# Patient Record
Sex: Female | Born: 1960 | Race: White | Hispanic: No | Marital: Married | Smoking: Current some day smoker
Health system: Southern US, Community
[De-identification: ages and names within clinical notes are randomized; demographics above are authoritative.]

## PROBLEM LIST (undated history)

## (undated) DIAGNOSIS — M1990 Unspecified osteoarthritis, unspecified site: Secondary | ICD-10-CM

## (undated) DIAGNOSIS — R51 Headache: Secondary | ICD-10-CM

## (undated) DIAGNOSIS — E119 Type 2 diabetes mellitus without complications: Secondary | ICD-10-CM

## (undated) DIAGNOSIS — T7840XA Allergy, unspecified, initial encounter: Secondary | ICD-10-CM

## (undated) DIAGNOSIS — K859 Acute pancreatitis without necrosis or infection, unspecified: Secondary | ICD-10-CM

## (undated) DIAGNOSIS — J45909 Unspecified asthma, uncomplicated: Secondary | ICD-10-CM

## (undated) DIAGNOSIS — Z9289 Personal history of other medical treatment: Secondary | ICD-10-CM

## (undated) DIAGNOSIS — I10 Essential (primary) hypertension: Secondary | ICD-10-CM

## (undated) DIAGNOSIS — K219 Gastro-esophageal reflux disease without esophagitis: Secondary | ICD-10-CM

## (undated) DIAGNOSIS — R519 Headache, unspecified: Secondary | ICD-10-CM

## (undated) HISTORY — DX: Allergy, unspecified, initial encounter: T78.40XA

---

## 1998-07-22 ENCOUNTER — Emergency Department (HOSPITAL_COMMUNITY): Admission: EM | Admit: 1998-07-22 | Discharge: 1998-07-22 | Payer: Self-pay | Admitting: Emergency Medicine

## 1998-07-23 ENCOUNTER — Encounter: Payer: Self-pay | Admitting: Emergency Medicine

## 1998-09-11 ENCOUNTER — Emergency Department (HOSPITAL_COMMUNITY): Admission: EM | Admit: 1998-09-11 | Discharge: 1998-09-11 | Payer: Self-pay | Admitting: *Deleted

## 2002-06-29 ENCOUNTER — Emergency Department (HOSPITAL_COMMUNITY): Admission: EM | Admit: 2002-06-29 | Discharge: 2002-06-29 | Payer: Self-pay | Admitting: Unknown Physician Specialty

## 2004-05-23 ENCOUNTER — Emergency Department (HOSPITAL_COMMUNITY): Admission: EM | Admit: 2004-05-23 | Discharge: 2004-05-23 | Payer: Self-pay | Admitting: Emergency Medicine

## 2006-07-31 ENCOUNTER — Emergency Department (HOSPITAL_COMMUNITY): Admission: EM | Admit: 2006-07-31 | Discharge: 2006-07-31 | Payer: Self-pay | Admitting: Emergency Medicine

## 2010-09-19 DIAGNOSIS — Z9289 Personal history of other medical treatment: Secondary | ICD-10-CM

## 2010-09-19 HISTORY — PX: CHOLECYSTECTOMY: SHX55

## 2010-09-19 HISTORY — DX: Personal history of other medical treatment: Z92.89

## 2010-12-04 ENCOUNTER — Inpatient Hospital Stay (HOSPITAL_COMMUNITY)
Admission: EM | Admit: 2010-12-04 | Discharge: 2010-12-13 | DRG: 417 | Disposition: A | Discharge status: Home / Self Care | Payer: Self-pay | Attending: Internal Medicine | Admitting: Internal Medicine

## 2010-12-04 ENCOUNTER — Emergency Department (HOSPITAL_COMMUNITY): Payer: Self-pay

## 2010-12-04 ENCOUNTER — Inpatient Hospital Stay (HOSPITAL_COMMUNITY): Payer: Self-pay

## 2010-12-04 DIAGNOSIS — N289 Disorder of kidney and ureter, unspecified: Secondary | ICD-10-CM | POA: Diagnosis not present

## 2010-12-04 DIAGNOSIS — E876 Hypokalemia: Secondary | ICD-10-CM | POA: Diagnosis not present

## 2010-12-04 DIAGNOSIS — Y836 Removal of other organ (partial) (total) as the cause of abnormal reaction of the patient, or of later complication, without mention of misadventure at the time of the procedure: Secondary | ICD-10-CM | POA: Diagnosis not present

## 2010-12-04 DIAGNOSIS — F17200 Nicotine dependence, unspecified, uncomplicated: Secondary | ICD-10-CM | POA: Diagnosis present

## 2010-12-04 DIAGNOSIS — E785 Hyperlipidemia, unspecified: Secondary | ICD-10-CM | POA: Diagnosis present

## 2010-12-04 DIAGNOSIS — D62 Acute posthemorrhagic anemia: Secondary | ICD-10-CM | POA: Diagnosis not present

## 2010-12-04 DIAGNOSIS — J45909 Unspecified asthma, uncomplicated: Secondary | ICD-10-CM | POA: Diagnosis present

## 2010-12-04 DIAGNOSIS — K859 Acute pancreatitis without necrosis or infection, unspecified: Secondary | ICD-10-CM | POA: Diagnosis present

## 2010-12-04 DIAGNOSIS — K5900 Constipation, unspecified: Secondary | ICD-10-CM | POA: Diagnosis not present

## 2010-12-04 DIAGNOSIS — IMO0002 Reserved for concepts with insufficient information to code with codable children: Secondary | ICD-10-CM | POA: Diagnosis not present

## 2010-12-04 DIAGNOSIS — I10 Essential (primary) hypertension: Secondary | ICD-10-CM | POA: Diagnosis present

## 2010-12-04 DIAGNOSIS — K8020 Calculus of gallbladder without cholecystitis without obstruction: Principal | ICD-10-CM | POA: Diagnosis present

## 2010-12-04 LAB — DIFFERENTIAL
Basophils Absolute: 0 10*3/uL (ref 0.0–0.1)
Basophils Relative: 0 % (ref 0–1)
Eosinophils Absolute: 0 10*3/uL (ref 0.0–0.7)
Eosinophils Relative: 0 % (ref 0–5)
Lymphocytes Relative: 7 % — ABNORMAL LOW (ref 12–46)
Lymphs Abs: 1.2 10*3/uL (ref 0.7–4.0)
Monocytes Absolute: 1 10*3/uL (ref 0.1–1.0)
Monocytes Relative: 6 % (ref 3–12)
Neutro Abs: 14.7 10*3/uL — ABNORMAL HIGH (ref 1.7–7.7)
Neutrophils Relative %: 87 % — ABNORMAL HIGH (ref 43–77)

## 2010-12-04 LAB — URINALYSIS, ROUTINE W REFLEX MICROSCOPIC
Nitrite: NEGATIVE
Specific Gravity, Urine: 1.017 (ref 1.005–1.030)
pH: 6.5 (ref 5.0–8.0)

## 2010-12-04 LAB — HEMOGLOBIN A1C: Mean Plasma Glucose: 114 mg/dL (ref <117)

## 2010-12-04 LAB — URINE MICROSCOPIC-ADD ON

## 2010-12-04 LAB — CBC
HCT: 45.4 % (ref 36.0–46.0)
MCHC: 35.2 g/dL (ref 30.0–36.0)
MCV: 92.7 fL (ref 78.0–100.0)
Platelets: 386 10*3/uL (ref 150–400)
RDW: 12.8 % (ref 11.5–15.5)

## 2010-12-04 LAB — COMPREHENSIVE METABOLIC PANEL
BUN: 18 mg/dL (ref 6–23)
Calcium: 9.3 mg/dL (ref 8.4–10.5)
Glucose, Bld: 159 mg/dL — ABNORMAL HIGH (ref 70–99)
Total Protein: 7.4 g/dL (ref 6.0–8.3)

## 2010-12-04 LAB — LIPASE, BLOOD: Lipase: 3004 U/L — ABNORMAL HIGH (ref 11–59)

## 2010-12-04 LAB — TSH: TSH: 0.874 u[IU]/mL (ref 0.350–4.500)

## 2010-12-04 MED ORDER — IOHEXOL 300 MG/ML  SOLN
100.0000 mL | Freq: Once | INTRAMUSCULAR | Status: AC | PRN
  Administered 2010-12-04: 100 mL via INTRAVENOUS

## 2010-12-05 LAB — DIFFERENTIAL
Basophils Absolute: 0 10*3/uL (ref 0.0–0.1)
Eosinophils Relative: 0 % (ref 0–5)
Lymphocytes Relative: 6 % — ABNORMAL LOW (ref 12–46)
Neutro Abs: 20.6 10*3/uL — ABNORMAL HIGH (ref 1.7–7.7)

## 2010-12-05 LAB — LACTIC ACID, PLASMA: Lactic Acid, Venous: 1.8 mmol/L (ref 0.5–2.2)

## 2010-12-05 LAB — CBC
HCT: 41.9 % (ref 36.0–46.0)
Platelets: 330 10*3/uL (ref 150–400)
RDW: 13.4 % (ref 11.5–15.5)
WBC: 23.4 10*3/uL — ABNORMAL HIGH (ref 4.0–10.5)

## 2010-12-05 LAB — COMPREHENSIVE METABOLIC PANEL
ALT: 351 U/L — ABNORMAL HIGH (ref 0–35)
AST: 100 U/L — ABNORMAL HIGH (ref 0–37)
Albumin: 3.3 g/dL — ABNORMAL LOW (ref 3.5–5.2)
CO2: 30 mEq/L (ref 19–32)
Calcium: 8.8 mg/dL (ref 8.4–10.5)
GFR calc Af Amer: >60 mL/min (ref >60)
Sodium: 142 mEq/L (ref 135–145)
Total Protein: 6.4 g/dL (ref 6.0–8.3)

## 2010-12-06 ENCOUNTER — Inpatient Hospital Stay (HOSPITAL_COMMUNITY): Payer: Self-pay

## 2010-12-06 DIAGNOSIS — K8020 Calculus of gallbladder without cholecystitis without obstruction: Secondary | ICD-10-CM

## 2010-12-06 DIAGNOSIS — K859 Acute pancreatitis without necrosis or infection, unspecified: Secondary | ICD-10-CM

## 2010-12-06 LAB — DIFFERENTIAL
Basophils Absolute: 0 10*3/uL (ref 0.0–0.1)
Basophils Relative: 0 % (ref 0–1)
Eosinophils Absolute: 0 10*3/uL (ref 0.0–0.7)
Eosinophils Relative: 0 % (ref 0–5)
Lymphocytes Relative: 5 % — ABNORMAL LOW (ref 12–46)
Lymphs Abs: 1.3 10*3/uL (ref 0.7–4.0)
Monocytes Absolute: 1.5 10*3/uL — ABNORMAL HIGH (ref 0.1–1.0)
Monocytes Relative: 6 % (ref 3–12)
Neutro Abs: 23.4 10*3/uL — ABNORMAL HIGH (ref 1.7–7.7)
Neutro Abs: 22.5 10*3/uL — ABNORMAL HIGH (ref 1.7–7.7)
Neutrophils Relative %: 89 % — ABNORMAL HIGH (ref 43–77)

## 2010-12-06 LAB — COMPREHENSIVE METABOLIC PANEL
AST: 42 U/L — ABNORMAL HIGH (ref 0–37)
Alkaline Phosphatase: 123 U/L — ABNORMAL HIGH (ref 39–117)
BUN: 11 mg/dL (ref 6–23)
CO2: 25 mEq/L (ref 19–32)
Chloride: 106 mEq/L (ref 96–112)
Creatinine, Ser: 0.84 mg/dL (ref 0.4–1.2)
GFR calc non Af Amer: >60 mL/min (ref >60)
Potassium: 3.2 mEq/L — ABNORMAL LOW (ref 3.5–5.1)
Total Bilirubin: 1 mg/dL (ref 0.3–1.2)

## 2010-12-06 LAB — CBC
HCT: 37.3 % (ref 36.0–46.0)
Hemoglobin: 12.5 g/dL (ref 12.0–15.0)
MCH: 32.1 pg (ref 26.0–34.0)
MCH: 32.1 pg (ref 26.0–34.0)
MCHC: 33.5 g/dL (ref 30.0–36.0)
MCV: 95.6 fL (ref 78.0–100.0)
Platelets: 271 10*3/uL (ref 150–400)
RBC: 3.83 MIL/uL — ABNORMAL LOW (ref 3.87–5.11)
RDW: 13.7 % (ref 11.5–15.5)

## 2010-12-06 LAB — HEMOGLOBIN A1C
Hgb A1c MFr Bld: 5.9 % — ABNORMAL HIGH (ref <5.7)
Mean Plasma Glucose: 123 mg/dL — ABNORMAL HIGH (ref <117)

## 2010-12-07 DIAGNOSIS — K859 Acute pancreatitis without necrosis or infection, unspecified: Secondary | ICD-10-CM

## 2010-12-07 DIAGNOSIS — K8020 Calculus of gallbladder without cholecystitis without obstruction: Secondary | ICD-10-CM

## 2010-12-07 LAB — CBC
MCHC: 33.2 g/dL (ref 30.0–36.0)
RDW: 13.6 % (ref 11.5–15.5)

## 2010-12-07 LAB — DIFFERENTIAL
Basophils Absolute: 0 10*3/uL (ref 0.0–0.1)
Basophils Relative: 0 % (ref 0–1)
Eosinophils Relative: 0 % (ref 0–5)
Monocytes Absolute: 1.4 10*3/uL — ABNORMAL HIGH (ref 0.1–1.0)
Neutro Abs: 17.5 10*3/uL — ABNORMAL HIGH (ref 1.7–7.7)

## 2010-12-07 LAB — COMPREHENSIVE METABOLIC PANEL
Albumin: 2.9 g/dL — ABNORMAL LOW (ref 3.5–5.2)
BUN: 10 mg/dL (ref 6–23)
Calcium: 8.7 mg/dL (ref 8.4–10.5)
Creatinine, Ser: 0.78 mg/dL (ref 0.4–1.2)
Total Protein: 6.4 g/dL (ref 6.0–8.3)

## 2010-12-07 LAB — AMYLASE: Amylase: 163 U/L — ABNORMAL HIGH (ref 0–105)

## 2010-12-07 LAB — LACTIC ACID, PLASMA: Lactic Acid, Venous: 0.7 mmol/L (ref 0.5–2.2)

## 2010-12-07 NOTE — Consult Note (Signed)
NAMEINEZ, STANTZ                  ACCOUNT NO.:  [id number]  MEDICAL RECORD NO.:  [id number]           PATIENT TYPE:  I  LOCATION:  5504                         FACILITY:  [facility]  PHYSICIAN:  Lennie Muckle, MD      DATE OF BIRTH:  1961/06/02  DATE OF CONSULTATION:  12/04/2010 DATE OF DISCHARGE:                                CONSULTATION   REQUESTING PHYSICIAN:  Anselmo Rod, MD, FACG  REASON FOR CONSULT:  Gallstone and pancreatitis.  A 50 year old female who was admitted on December 04, 2010, for gallstones and pancreatitis, had 1-day history of gastric discomfort, nausea.  No emesis.  No chills.  No diarrhea.  She came in to the emergency department due to the severe epigastric discomfort.  She had an ultrasound revealing stones, common bile duct dilation of 1.1 cm.  She has a CT scan which showed prominence at the pancreatic head, increased lipase at 3000, bilirubin 2.6, alkaline phosphatase 195, AST of 524, ALT of 664.  White count is elevated at 16.4.  She was seen by Dr. Loreta Ave who is, I think, going to perform ERCP tomorrow.  Surgery is consulted for later cholecystectomy.  She states she has not had previous episodes of abdominal discomfort.  There is a family history of gallstones.  Both her parents have had their gallbladders removed.  She has not had any loose stools.  She states she continues to have abdominal discomfort and severe nausea.  PAST MEDICAL HISTORY:  Asthma and obesity.  PAST SURGICAL HISTORY:  Negative.  SOCIAL HISTORY:  She does drink occasionally and smokes.  She is married, has several children.  ALLERGIES:  CODEINE and PENICILLIN.  MEDICATIONS:  Theophylline and Advair nebulizer treatment.  FAMILY HISTORY:  As dictated.  REVIEW OF SYSTEMS:  Negative other than the HPI.  She occasionally has shortness of breath and uses albuterol treatment.  PHYSICAL EXAMINATION:  GENERAL:  She appears uncomfortable and is lying in bed.  She is obese. VITAL  SIGNS:  Temperature is 98.4, blood pressure 137/91, pulse 96. HEENT:  Head is normocephalic.  Extraocular movements are intact. Pupils are equal and round.  Sclerae are clear.  Nares are clear.  Oral mucosa is moist. NECK:  No tenderness.  No obvious swelling.  Trachea is midline. Thyroid without palpable abnormalities. CHEST:  Clear to auscultation bilaterally.  Normal expansion and excursion. CARDIOVASCULAR:  Tachycardic.  No murmurs, gallops, or rubs.  Pulses palpated in the lower extremity. SKIN:  Warm to touch.  No jaundice is noted.  No rashes. ABDOMEN:  Obese.  Soft.  Mild epigastric discomfort.  She does have some distention.  No masses, but exam is limited due to body habitus. MUSCULOSKELETAL:  No deformities are seen.  No pain with palpation in the joints.  Labs as dictated.  ASSESSMENT AND PLAN:  Gallstone pancreatitis likely endoscopic retrograde cholangiopancreatography tomorrow by Dr. Loreta Ave.  We will allow the pancreatitis to resolve before she gets her cholecystectomy.  We agree with aggressive IV fluid resuscitation for now and pain control. We will follow.     Lennie Muckle, MD  ALA/MEDQ  D:  12/04/2010  T:  12/05/2010  Job:  [id number]  Electronically Signed by Bertram Savin MD on 12/07/2010 12:08:59 PM

## 2010-12-08 LAB — LIPASE, BLOOD: Lipase: 107 U/L — ABNORMAL HIGH (ref 11–59)

## 2010-12-08 LAB — CBC
HCT: 34.1 % — ABNORMAL LOW (ref 36.0–46.0)
MCV: 92.2 fL (ref 78.0–100.0)
Platelets: 264 10*3/uL (ref 150–400)
RBC: 3.7 MIL/uL — ABNORMAL LOW (ref 3.87–5.11)
WBC: 12.5 10*3/uL — ABNORMAL HIGH (ref 4.0–10.5)

## 2010-12-08 LAB — COMPREHENSIVE METABOLIC PANEL
ALT: 88 U/L — ABNORMAL HIGH (ref 0–35)
Alkaline Phosphatase: 118 U/L — ABNORMAL HIGH (ref 39–117)
Glucose, Bld: 78 mg/dL (ref 70–99)
Potassium: 3.3 mEq/L — ABNORMAL LOW (ref 3.5–5.1)
Sodium: 139 mEq/L (ref 135–145)
Total Protein: 6.2 g/dL (ref 6.0–8.3)

## 2010-12-09 ENCOUNTER — Inpatient Hospital Stay (HOSPITAL_COMMUNITY): Payer: Self-pay

## 2010-12-09 ENCOUNTER — Other Ambulatory Visit: Payer: Self-pay | Admitting: General Surgery

## 2010-12-09 LAB — CBC
Hemoglobin: 10.4 g/dL — ABNORMAL LOW (ref 12.0–15.0)
Hemoglobin: 9.5 g/dL — ABNORMAL LOW (ref 12.0–15.0)
MCH: 31.4 pg (ref 26.0–34.0)
MCV: 93.1 fL (ref 78.0–100.0)
Platelets: 313 10*3/uL (ref 150–400)
RBC: 3.34 MIL/uL — ABNORMAL LOW (ref 3.87–5.11)
RBC: 3.03 MIL/uL — ABNORMAL LOW (ref 3.87–5.11)
WBC: 16.2 10*3/uL — ABNORMAL HIGH (ref 4.0–10.5)

## 2010-12-09 LAB — BASIC METABOLIC PANEL
CO2: 27 mEq/L (ref 19–32)
Calcium: 8.1 mg/dL — ABNORMAL LOW (ref 8.4–10.5)
Chloride: 100 mEq/L (ref 96–112)
GFR calc Af Amer: >60 mL/min (ref >60)
Sodium: 136 mEq/L (ref 135–145)

## 2010-12-09 LAB — COMPREHENSIVE METABOLIC PANEL
ALT: 59 U/L — ABNORMAL HIGH (ref 0–35)
AST: 19 U/L (ref 0–37)
Albumin: 2.7 g/dL — ABNORMAL LOW (ref 3.5–5.2)
CO2: 25 mEq/L (ref 19–32)
Calcium: 8.4 mg/dL (ref 8.4–10.5)
GFR calc Af Amer: >60 mL/min (ref >60)
Sodium: 139 mEq/L (ref 135–145)
Total Protein: 6.1 g/dL (ref 6.0–8.3)

## 2010-12-09 LAB — HCG, SERUM, QUALITATIVE: Preg, Serum: NEGATIVE

## 2010-12-09 LAB — PROTIME-INR: INR: 1.03 (ref 0.00–1.49)

## 2010-12-09 LAB — APTT: aPTT: 29 seconds (ref 24–37)

## 2010-12-09 LAB — POCT I-STAT 4, (NA,K, GLUC, HGB,HCT)
Glucose, Bld: 138 mg/dL — ABNORMAL HIGH (ref 70–99)
HCT: 27 % — ABNORMAL LOW (ref 36.0–46.0)
Hemoglobin: 9.2 g/dL — ABNORMAL LOW (ref 12.0–15.0)

## 2010-12-10 LAB — BASIC METABOLIC PANEL
BUN: 15 mg/dL (ref 6–23)
CO2: 24 mEq/L (ref 19–32)
Chloride: 103 mEq/L (ref 96–112)
Glucose, Bld: 134 mg/dL — ABNORMAL HIGH (ref 70–99)
Potassium: 3.4 mEq/L — ABNORMAL LOW (ref 3.5–5.1)

## 2010-12-10 LAB — CBC
HCT: 24.3 % — ABNORMAL LOW (ref 36.0–46.0)
Hemoglobin: 8.2 g/dL — ABNORMAL LOW (ref 12.0–15.0)
MCV: 93.1 fL (ref 78.0–100.0)
RDW: 13.4 % (ref 11.5–15.5)
WBC: 12.3 10*3/uL — ABNORMAL HIGH (ref 4.0–10.5)

## 2010-12-10 LAB — MAGNESIUM: Magnesium: 2 mg/dL (ref 1.5–2.5)

## 2010-12-11 LAB — CBC
HCT: 27.5 % — ABNORMAL LOW (ref 36.0–46.0)
Hemoglobin: 9.4 g/dL — ABNORMAL LOW (ref 12.0–15.0)
MCV: 92.3 fL (ref 78.0–100.0)
Platelets: 274 10*3/uL (ref 150–400)
RBC: 2.98 MIL/uL — ABNORMAL LOW (ref 3.87–5.11)
WBC: 13.7 10*3/uL — ABNORMAL HIGH (ref 4.0–10.5)

## 2010-12-11 LAB — CROSSMATCH: Unit division: 0

## 2010-12-11 LAB — COMPREHENSIVE METABOLIC PANEL
Albumin: 2.3 g/dL — ABNORMAL LOW (ref 3.5–5.2)
Alkaline Phosphatase: 105 U/L (ref 39–117)
BUN: 12 mg/dL (ref 6–23)
Chloride: 104 mEq/L (ref 96–112)
Potassium: 3.5 mEq/L (ref 3.5–5.1)
Total Bilirubin: 0.7 mg/dL (ref 0.3–1.2)

## 2010-12-11 NOTE — H&P (Signed)
NAMECHAELA, BRANSCUM                  ACCOUNT NO.:  [id number]  MEDICAL RECORD NO.:  [id number]           PATIENT TYPE:  E  LOCATION:  [facility]                         FACILITY:  [facility]  PHYSICIAN:  Houston Siren, MD           DATE OF BIRTH:  12-09-1960  DATE OF ADMISSION:  12/04/2010 DATE OF DISCHARGE:                             HISTORY & PHYSICAL   PRIMARY CARE PHYSICIAN:  Windle Guard, M.D.  ADVANCE DIRECTIVE:  Full code.  REASON FOR ADMISSION:  Acute pancreatitis.  HISTORY OF PRESENT ILLNESS:  This is a 50 year old female with benign past medical history including tobacco abuse and asthma, presents with 1- day history of epigastric pain that comes rather acutely, nausea, vomiting but no fever or chills.  She does drink occasional alcohol and is an active smoker.  She had had no black stool, bloody stool, or any diarrhea.  There has been no previous similar symptoms.  Evaluation in emergency room shows right upper quadrant ultrasound with multiple gallstones and mildly dilated extrahepatic duct.  Her liver function tests were elevated in the 500s with alkaline phosphatase of 200.  She has lipase in the 3000.  She also have leukocytosis with a white count of 16,000, hemoglobin of 16, and blood glucose of 159.  Her calcium is 9.3. Hospitalist was asked to admit the patient for acute gallstone pancreatitis.  PAST MEDICAL HISTORY:  Asthma, tobacco abuse, obesity.  SOCIAL HISTORY:  She drinks occasionally and does smoke currently.  She is not working, prior she worked as a Theatre stage manager.  She is married with children.  ALLERGIES:  CODEINE and PENICILLIN.  CURRENT MEDICATIONS:  Theophylline, Advair, and nebulizer treatment.  FAMILY HISTORY:  Noncontributory.  PHYSICAL EXAMINATION:  VITAL SIGNS:  Blood pressure 180/90, pulse is 72, respiratory rate of 16, temperature 97.5. GENERAL:  She appear uncomfortable but in no apparent distress.  Sclerae are nonicteric. NECK:   Supple.  Speech is fluent.  Tongue is midline.  She has facial symmetry. CARDIAC:  S1, S2 regular.  I did not hear any murmur, rub, or gallop. LUNGS:  Clear. ABDOMEN:  Obese, tender over the epigastric area but not on the right upper quadrant area.  Bowel sounds present.  No ascites and no Cullen sign. EXTREMITIES:  No edema.  No calf tenderness.  Good distal pulses bilaterally. SKIN:  Warm and dry. NEUROLOGIC:  Unremarkable. PSYCHIATRIC:  Unremarkable as well.  OBJECTIVE FINDINGS:  White count 16,000, hemoglobin 16.0, MCV of 93, platelet count 386,000.  Serum sodium 138, potassium 4.1, CO2 24, glucose 160, creatinine 0.95, albumin 4.3, SGOT 524, SGPT 664, alkaline phosphatase 195, total bili 2.6.  Abdominal ultrasound shows no intrahepatic bile dilatation, extrahepatic bile mildly dilated measuring 1.1 cm.  IMPRESSION:  This a 50 year old obese female, likely having gallstone pancreatitis.  We will admit, place her on n.p.o.  She will be given intravenous fluid.  Analgesic will be with IV Dilaudid.  Dr. Loreta Ave of Gastroenterology Service was consulted and will see the patient.  She will be made n.p.o. and likely will need ERCP with question  of stone removal.  I will check a theophylline level and thyroid function tests. We will continue the Advair but will stop the theophylline.  She is not wheezing.  She is a full code and will be admitted to [facility].  Suspect elevated white count is stress demargination, there is no evidence of infection, and we will hold off on antibiotics.     Houston Siren, MD     PL/MEDQ  D:  12/04/2010  T:  12/04/2010  Job:  [id number]  Electronically Signed by Houston Siren  on 12/11/2010 10:00:35 PM

## 2010-12-12 LAB — BASIC METABOLIC PANEL
BUN: 10 mg/dL (ref 6–23)
Calcium: 8.1 mg/dL — ABNORMAL LOW (ref 8.4–10.5)
Creatinine, Ser: 0.86 mg/dL (ref 0.4–1.2)
GFR calc non Af Amer: >60 mL/min (ref >60)
Potassium: 3.2 mEq/L — ABNORMAL LOW (ref 3.5–5.1)

## 2010-12-12 LAB — CBC
MCH: 32.1 pg (ref 26.0–34.0)
MCV: 93.7 fL (ref 78.0–100.0)
Platelets: 314 10*3/uL (ref 150–400)
RDW: 14.1 % (ref 11.5–15.5)
WBC: 13.2 10*3/uL — ABNORMAL HIGH (ref 4.0–10.5)

## 2010-12-13 LAB — CBC
Platelets: 404 10*3/uL — ABNORMAL HIGH (ref 150–400)
RDW: 13.4 % (ref 11.5–15.5)
WBC: 14.7 10*3/uL — ABNORMAL HIGH (ref 4.0–10.5)

## 2010-12-13 LAB — COMPREHENSIVE METABOLIC PANEL
ALT: 264 U/L — ABNORMAL HIGH (ref 0–35)
Albumin: 2.4 g/dL — ABNORMAL LOW (ref 3.5–5.2)
Alkaline Phosphatase: 136 U/L — ABNORMAL HIGH (ref 39–117)
GFR calc Af Amer: >60 mL/min (ref >60)
Potassium: 3.4 mEq/L — ABNORMAL LOW (ref 3.5–5.1)
Sodium: 135 mEq/L (ref 135–145)
Total Protein: 5.9 g/dL — ABNORMAL LOW (ref 6.0–8.3)

## 2010-12-15 NOTE — Discharge Summary (Signed)
Melinda [name], Melinda [name]                  ACCOUNT NO.:  [id number]  MEDICAL RECORD NO.:  [id number]           PATIENT TYPE:  I  LOCATION:  5504                         FACILITY:  [facility]  PHYSICIAN:  Andreas Blower, MD       DATE OF BIRTH:  [birth year]  DATE OF ADMISSION:  12/04/2010 DATE OF DISCHARGE:  12/13/2010                              DISCHARGE SUMMARY   PRIMARY CARE PHYSICIAN:  Windle Guard, MD  SURGEON:  [facility].  DISCHARGE DIAGNOSES: 1. Biliary pancreatitis status post laparoscopic cholecystectomy on     December 09, 2010. 2. Status post cholecystectomy. 3. Anemia due to acute blood loss from surgery. 4. Acute renal insufficiency. 5. Hypokalemia. 6. Hypertension. 7. History of asthma. 8. Obesity. 9. Tobacco use.  DISCHARGE MEDICATIONS: 1. Zofran 4 mg p.o. every 6 hours as needed for nausea. 2. Oxycodone/APAP 5/325 mg 1 tablet p.o. q.6 hours as needed for pain. 3. Advair Diskus 500/50 one puff twice daily. 4. Albuterol inhaler 2 puffs inhaled every 4 hours as needed for     shortness of breath. 5. Theophylline 200 mg 2 tablets in the morning and 3 tablets in p.m.     twice daily.  BRIEF ADMITTING HISTORY AND PHYSICAL:  Ms. Alviar is a 50 year old female with a history of tobacco use, asthma who presents with the abdominal pain.  RADIOLOGY/IMAGING: 1. The patient had CT of the abdomen and pelvis with contrast which     showed acute pancreatitis with peripancreatic exudate and edema of     the head and neck of pancreas.  Multiple gallstones are seen.     Difficult to exclude a small distal common bile duct calculus. 2. The patient had abdominal ultrasound on December 04, 2010, which     showed cholelithiasis with mild exophytic bile duct dilatation. 3. The patient had chest x-ray 2 view on December 06, 2010, which showed     cardiomegaly as well as low lung volumes with bibasilar atelectasis     or scarring. 4. The patient had intraoperative cholangiogram which  was negative. 5. The patient had another chest x-ray on December 09, 2010, which showed     cardiomegaly without acute disease.  LABS:  CBC shows white count of 14.7, hemoglobin 8.7, hematocrit 28.8. Electrolytes normal except potassium of 3.4, creatinine is 0.89.  Liver function test showed alk phos 136, AST is 68, ALT is 263, AST had peaked at 547, ALT was 722.  Serum pregnancy was negative.  HOSPITAL COURSE BY PROBLEM: 1. Biliary pancreatitis.  Initially, the patient was made n.p.o.  The     patient was aggressively hydrated on fluids.  The patient was     evaluated by Surgery and decision was made for laparoscopic     cholecystectomy on December 09, 2010.  The patient was initially     started on imipenem on December 06, 2010, which was continued until     December 12, 2010.  Postsurgery, the patient was started on clear     liquid diet and her diet was advanced as tolerated.  The patient  has a JP drain in place from surgery which she will be discharged     home on.  She will need to follow up with Surgery in 7-10 days and     determine when the JP drain needs to come out. 2. Anemia due to acute blood loss from surgery.  During surgery, the     patient had liver bed venous leak which caused extra blood loss.     As a result, she was given 1 unit of PRBC on December 10, 2010.  Her     hemoglobin has been stable since transfusion. 3. Mild acute renal insufficiency postsurgery, resolved during the     course of hospital stay. 4. Hypokalemia, replace as needed. 5. Hypertension, stable. 6. History of asthma, stable. 7. Obesity, outpatient management.  DISPOSITION AND FOLLOWUP:  The patient is to follow up with her primary care physician in 1-2 weeks.  The patient is to follow up with General Surgery in 7-10 days and Surgery to make decision about when to take the JP drain out.  Time spent on discharge talking to the patient and coordinating care was 35 minutes.   Andreas Blower,  MD   SR/MEDQ  D:  12/13/2010  T:  12/14/2010  Job:  [id number]  Electronically Signed by Wardell Heath Damire Remedios  on 12/15/2010 02:16:34 PM

## 2010-12-16 NOTE — Op Note (Signed)
NAME:  Melinda [name], Melinda [name]                  ACCOUNT NO.:  [id number]  MEDICAL RECORD NO.:  [id number]           PATIENT TYPE:  I  LOCATION:  5504                         FACILITY:  [facility]  PHYSICIAN:  Gabrielle Dare. Janee Morn, M.D.DATE OF BIRTH:  09-23-1960  DATE OF PROCEDURE:  12/09/2010 DATE OF DISCHARGE:                              OPERATIVE REPORT   PREOPERATIVE DIAGNOSIS:  Biliary pancreatitis.  POSTOPERATIVE DIAGNOSIS:  Biliary pancreatitis.  PROCEDURE:  Laparoscopic cholecystectomy with intraoperative cholangiogram.  SURGEON:  Gabrielle Dare. Janee Morn, MD  ASSISTANT:  Anselm Pancoast. Weatherly, MD  FINDINGS:  Liver bed venous leak causing extra blood loss.  ESTIMATED BLOOD LOSS:  500 mL.  HISTORY OF PRESENT ILLNESS:  Melinda [name] is a 50 year old female who was admitted with biliary pancreatitis in December 04, 2010.  Liver function tests, amylase and lipase have gradually normalized.  Left-sided abdominal pain is also resolved.  Is here for cholecystectomy today.  PROCEDURE IN DETAIL:  Informed consent was obtained from the patient. She was identified in the preop holding area.  She received intravenous antibiotics.  She was brought to the operating room.  General endotracheal anesthesia was administered by the anesthesia staff.  Her abdomen was prepped and draped in sterile fashion.  We did a time-out procedure. The infraumbilical region was infiltrated with 0.25% Marcaine and an infraumbilical incision was made.  Subcutaneous tissues were dissected down revealing the anterior fascia.  This was divided sharply along the midline and the peritoneal cavity was entered under direct vision.  A 0-Vicryl pursestring suture was placed around the fascial opening.  The Hasson trocar was inserted into the abdomen.  The abdomen was insufflated with carbon dioxide in a standard fashion under direct vision.  A 5-mm epigastric and two 5-mm lateral ports were placed. Marcaine 0.25% with epinephrine was used at  all port sites.  There was noted to be a lot of omental adhesions up to the anterior abdominal wall.  These were taken down with cautery.  Good hemostasis.  The dome of the gallbladder was retracted superomedially.  The infundibulum was retracted inferolaterally.  Dissection began laterally and progressed medially identifying the cystic duct and cystic artery.  Dissection continued until a large window was created around the cystic duct with a critical view between the cystic duct, the infundibulum of the gallbladder, and the liver.  Next with placing a clip at the infundibulum cystic duct junction, small nick was made in the cystic duct and Cook cholangiogram catheter was inserted.  Intraoperative cholangiogram was obtained demonstrating no common bile duct defects and good flow of contrast into the duodenum.  Next, the cholangiogram catheter was removed, 3 clips were placed proximally on the cystic duct, and it was divided.  Cystic artery was further dissected out, this was clipped 3 times proximally and divided distally with cautery.  The gallbladder was then gradually taken off the liver bed with the Bovie cautery achieving good hemostasis until we got about two-thirds of the way up the liver bed.  We encountered a venous leak, we entered this and it caused some significant bleeding.  The initial cautery did  not control it, we placed some Surgicel snow and then were able to free off the gallbladder from around this area and the gallbladder was removed from the liver bed the rest of way using Bovie cautery and the remainder of the liver bed was hemostatic.  The gallbladder was placed in an EndoCatch bag and removed from the abdomen via the infraumbilical port site.  Please note that when we removed the gallbladder, we also noted that the posterior branch of the cystic artery due to the orientation we wanted to use a 10-mm clip and 5-mm epigastric port was upsized to attempt to colpate  that.  Next, the Surgicel snow was gradually removed and we attempted to cauterize the venous leak area and there was still bleeding too much, so a new piece of Surgicel plus Ray-Tec was temporarily placed in the area and pressure was held for several minutes.  We then took this down and were able to cauterize the area and maintain partial control.  We changed our Surgicel snow out and again held pressure with new piece of new Ray-Tec piece in place.  We irrigated out the remainder of the abdomen.  This seemed to achieve hemostasis in the liver bed.  At this time after holding that in place for several minutes, the old and new Ray-Tec were removed as well as the old Surgicel snow.  They were all placed in a new EndoCatch bag and removed from the infraumbilical wound.  A new piece of Surgicel snow was placed in the liver bed.  There appeared to be excellent hemostasis. Significant amount of blood loss.  Hemoglobin checked in the operating room was around 9, so we observed the patient for extended period of time in the operating room letting off the gas from the abdomen to make sure there was no bleeding.  The Surgicel snow remained in place on the liver bed and there was no further hemorrhage whatsoever.  Next, we placed a 19-French Blake drain up in the gallbladder fossa and was brought out through the lateral port site and secured with 2-0 nylon suture.  The liver bed was then observed a little bit longer and there was no bleeding at all.  At this time, the JP was connected to the bulb suction.  From the bulb suction, pneumoperitoneum was released.  Ports were removed.  The skin of each wound was irrigated copiously and then closed with running 4-0 Vicryl subcuticular stitch followed by Dermabond.  Dressing sponge was placed around the drain exit and the patient tolerated the remainder of procedure well and was taken in stable condition to recovery room with plans to recheck her blood  count, typing cross was sent during the case in case it was needed.     Gabrielle Dare Janee Morn, M.D.     BET/MEDQ  D:  12/09/2010  T:  12/10/2010  Job:  [id number]  cc:   Rachael Fee, MD  Electronically Signed by Violeta Gelinas M.D. on 12/16/2010 01:42:10 PM

## 2010-12-28 NOTE — Consult Note (Signed)
NAMEGLENNICE, [name]                  ACCOUNT NO.:  [id number]  MEDICAL RECORD NO.:  [id number]           PATIENT TYPE:  I  LOCATION:  5504                         FACILITY:  [facility]  PHYSICIAN:  Anselmo Rod, MD, FACGDATE OF BIRTH:  1961-09-06  DATE OF CONSULTATION:  12/04/2010 DATE OF DISCHARGE:                                CONSULTATION   Cross cover for [facility].  REASON FOR CONSULTATION:  Acute pancreatitis, needs ERCP.  ASSESSMENT: 1. Acute biliary pancreatitis with lipase of 3004. 2. Cholelithiasis on ultrasound. 3. Acute abdominal pain in right upper quadrant epigastrium x3 days. 4. Tobacco abuse. 5. Asthma on Advair, albuterol and theophylline. 6. Hypertension. 7. Hyperglycemia with a blood glucose of 150, rule out diabetes. 8. Morbid obesity.  RECOMMENDATIONS: 1. Give the patient 4 liters of lactated Ringer's now wide open. 2. CT scan of abdomen and pelvis with contrast today. 3. Given Dilaudid on schedule.4. N.p.o. for now. 5. Start broad spectrum antibiotics like Zosyn. 6. Check a hemoglobin A1c with the next blood draw.  DISCUSSION:  [name] is a 50 year old white female who came to the [facility] ER by EMS with a 3 day history of nausea, vomiting and abdominal pain according to the parents who are in the room with her. She was in her usual state of health until Thursday 12/02/2010 when she started developing right upper quadrant pain with severe nausea and vomiting.  In the ER an ultrasound revealed cholelithiasis with multiple gallstones in the gallbladder, no intrahepatic ductal dilatation was noted, extrahepatic ductal dilatation was measured at 1.1 cm.  Therefore consultation was procured from a GI standpoint.  The patient is in significant pain and unable to give much of a history.  According to Dr. Marigene Ehlers history the patient denies any history of fevers, chills, rigors, melena, hematochezia, diarrhea or constipation.  She has  never had similar symptoms in the past.  Significant LFT abnormalities were noted and therefore the patient was hospitalized for further evaluation and treatment.  PAST MEDICAL HISTORY:  See list above.  ALLERGIES:  The patient is allergic to PENICILLIN and CODEINE.  MEDICATIONS:  In the hospital are Advair, Dilaudid, Zofran, Ventolin, hydralazine.  SOCIAL HISTORY:  The patient lives in [place].  She is currently not smoking but has smoked for several years.  She drinks alcohol on social occasions.  She is unemployed at the present time, she is laid off.  She is married with children.  FAMILY HISTORY:  Noncontributory.  PHYSICAL EXAMINATION:  GENERAL:  On physical examination the patient is a middle-aged white female in significant distress, morbidly obese. VITAL SIGNS:  Temperature 98 degrees Fahrenheit, blood pressure 181/110, pulse of 85 per minute, respiratory rate 18. HEENT:  Facial symmetry preserved.  Oropharyngeal mucosa without exudates. NECK:  Supple. CHEST:  Bilateral wheezing present. HEART:  S1, S2.  The patient seems somewhat slightly tachycardic. ABDOMEN:  Obese with right upper quadrant epigastric tenderness on palpation with guarding and no evidence of rebound or rigidity.  Laboratory evaluation revealed lipase of 3004, sodium 138, potassium 4.1, chloride 103, CO2 24, glucose 159, BUN 18,  creatinine 0.95.  Total bilirubin of 2.6, alkaline phosphatase 196, AST 524, ALT 64, albumin 4.3.  White count 16.8, hemoglobin 16, platelet count 386, MCV of 92.7, 87% neutrophils.  Ultrasound showed multiple gallstones with mild ductal dilatation 1.1 cm.  PLAN:  As above.  Further recommendations will be made in followup.     Anselmo Rod, MD, [facility]     JNM/MEDQ  D:  12/04/2010  T:  12/04/2010  Job:  [id number]  cc:   Hedwig Morton. Juanda Chance, MD Windle Guard, M.D.  Electronically Signed by Charna Elizabeth M.D. on 12/28/2010 06:18:47 AM

## 2012-04-22 IMAGING — RF DG CHOLANGIOGRAM OPERATIVE
1 series · 6 of 6 positions shown · non-contrast
Comparison: CT abdomen pelvis of 12/04/2010 and ultrasound abdomen
of 12/04/2010

CLINICAL DATA: Gallstones, acute pancreatitis

INTRAOPERATIVE CHOLANGIOGRAM
TECHNIQUE: Cholangiographic images from the C-arm fluoroscopic
device were submitted for interpretation post-operatively.  Please
see the procedural report for the amount of contrast and the
fluoroscopy time utilized.

[Series 1: run · 3 acquisitions, 6 frames shown]
[im 1/3]
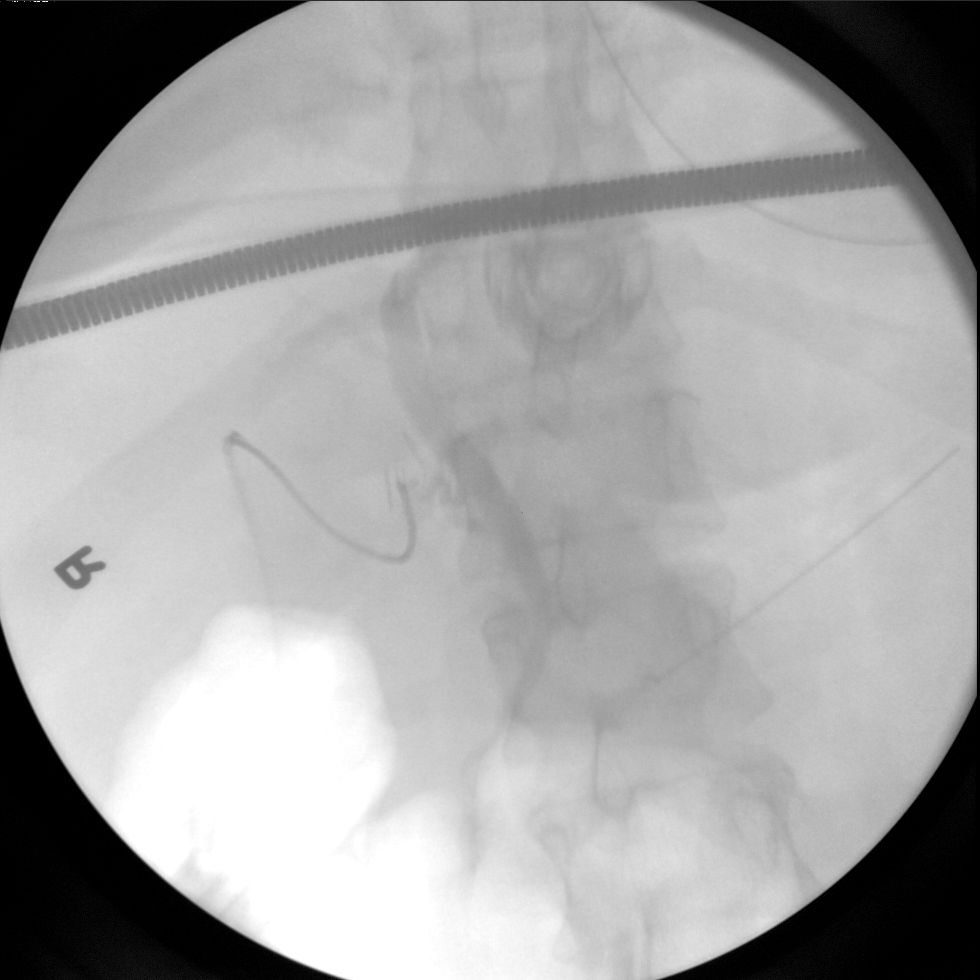
[im 1/3]
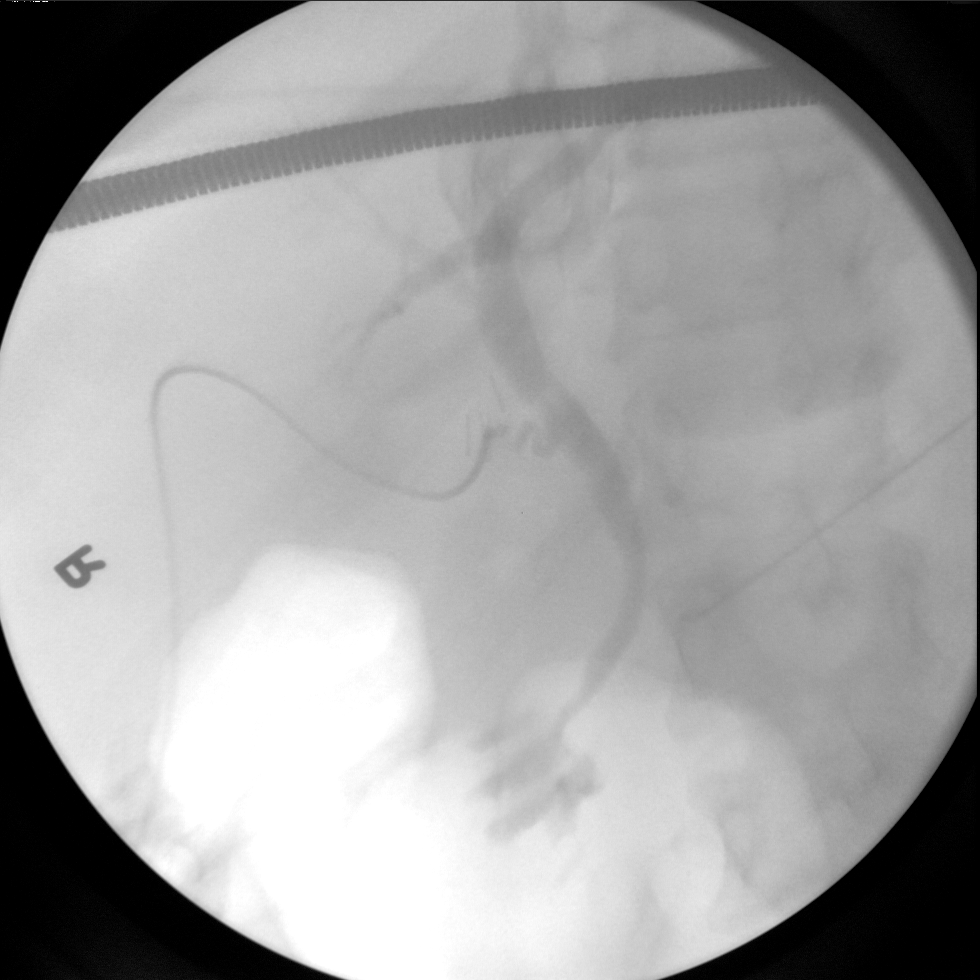
[im 1/3]
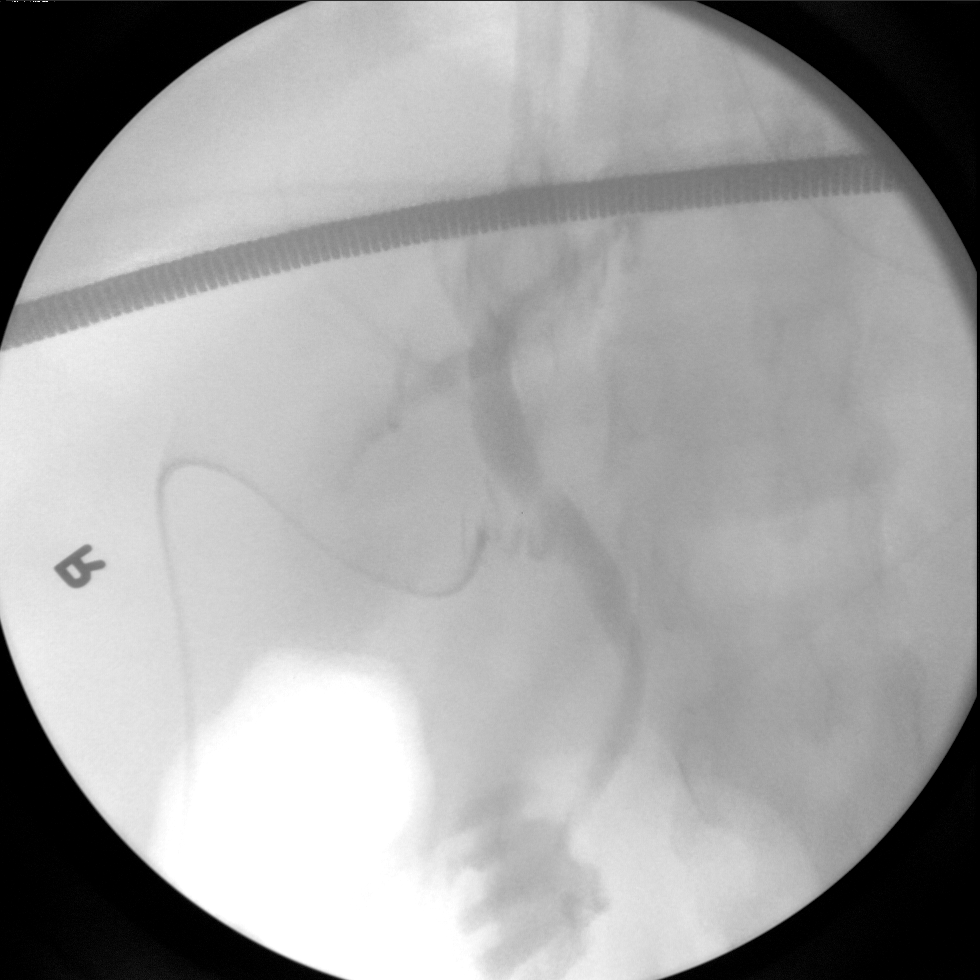
[im 1/3]
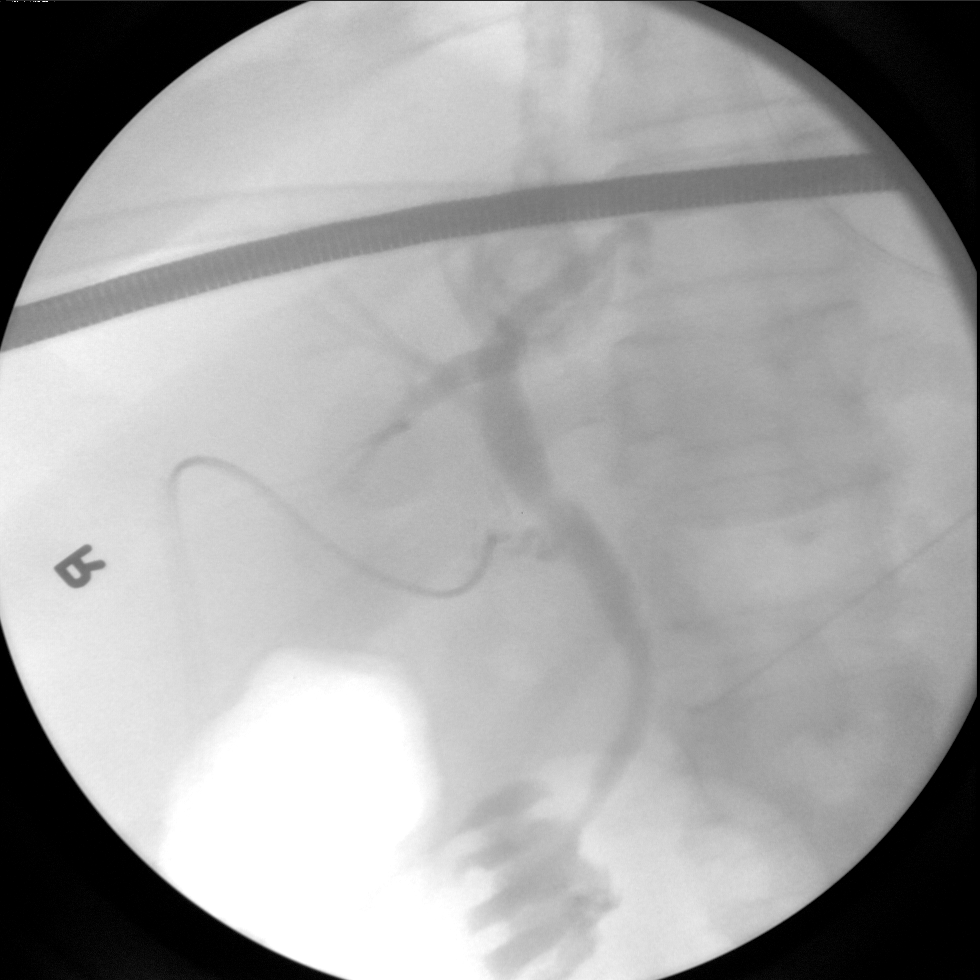
[im 2/3]
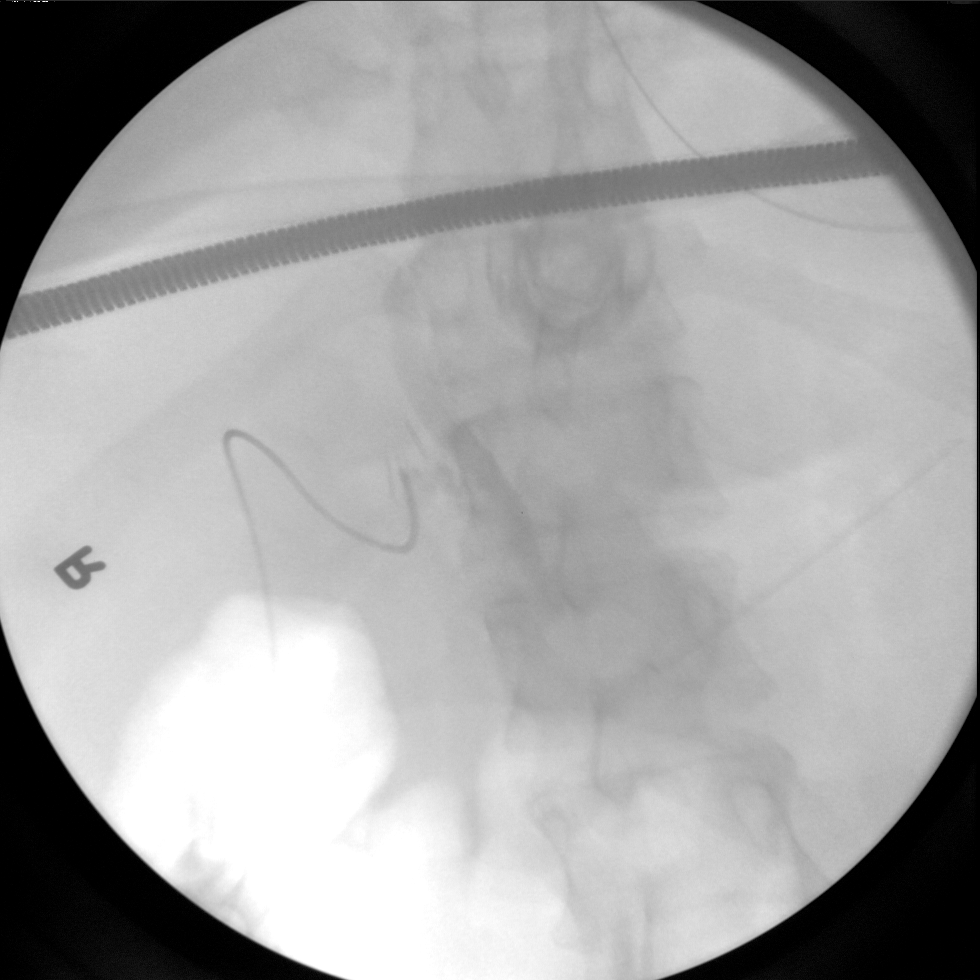
[im 3/3]
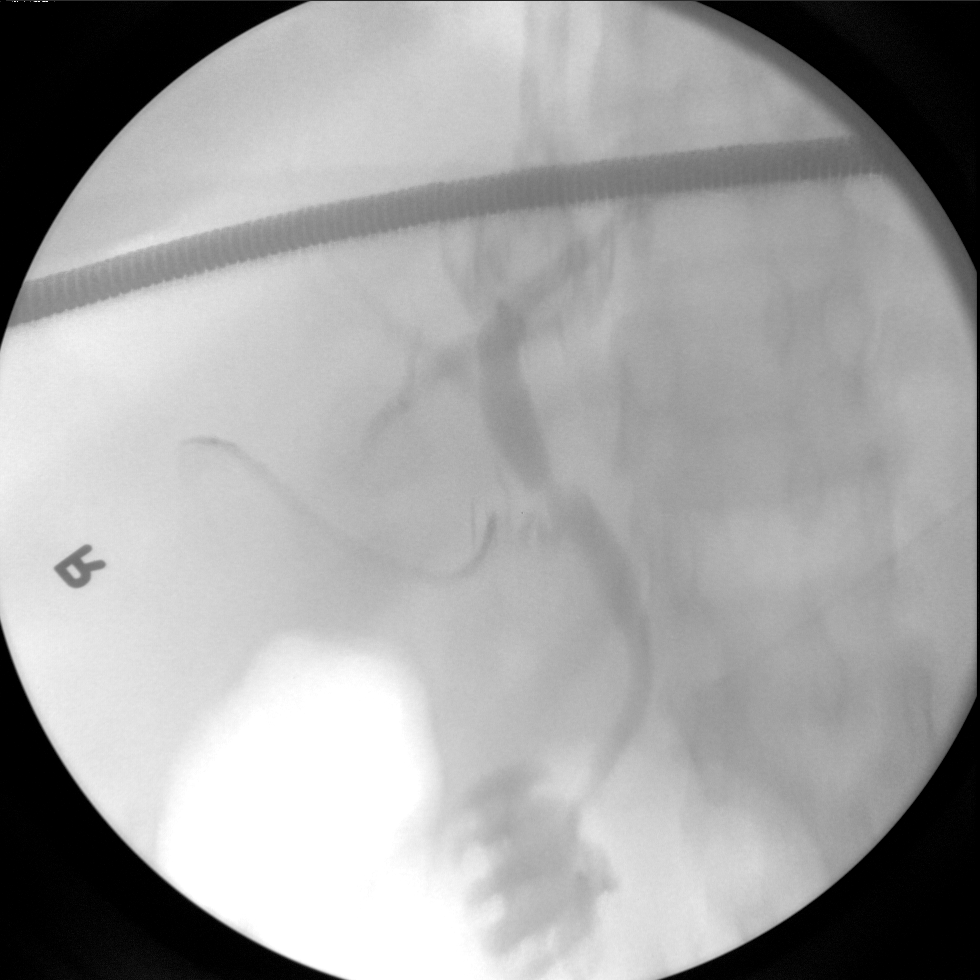

[6 of 6 positions shown; findings below may reference images not displayed]

FINDINGS: Contrast was injected via the cystic duct.  There is
good visualization of the common bile duct.  No calculi are seen
and there is no evidence of obstruction, with passage of contrast
into the duodenal loop.
IMPRESSION: Negative intraoperative cholangiogram.

## 2012-11-08 ENCOUNTER — Inpatient Hospital Stay (HOSPITAL_COMMUNITY)
Admission: EM | Admit: 2012-11-08 | Discharge: 2012-11-12 | DRG: 439 | Disposition: A | Discharge status: Home / Self Care | Payer: MEDICAID | Attending: Family Medicine | Admitting: Family Medicine

## 2012-11-08 ENCOUNTER — Encounter (HOSPITAL_COMMUNITY): Payer: Self-pay | Admitting: Cardiology

## 2012-11-08 ENCOUNTER — Observation Stay (HOSPITAL_COMMUNITY): Payer: Self-pay

## 2012-11-08 DIAGNOSIS — J45909 Unspecified asthma, uncomplicated: Secondary | ICD-10-CM | POA: Diagnosis present

## 2012-11-08 DIAGNOSIS — R030 Elevated blood-pressure reading, without diagnosis of hypertension: Secondary | ICD-10-CM | POA: Diagnosis present

## 2012-11-08 DIAGNOSIS — Z79899 Other long term (current) drug therapy: Secondary | ICD-10-CM

## 2012-11-08 DIAGNOSIS — K862 Cyst of pancreas: Secondary | ICD-10-CM | POA: Diagnosis present

## 2012-11-08 DIAGNOSIS — IMO0001 Reserved for inherently not codable concepts without codable children: Secondary | ICD-10-CM | POA: Diagnosis present

## 2012-11-08 DIAGNOSIS — F17200 Nicotine dependence, unspecified, uncomplicated: Secondary | ICD-10-CM | POA: Diagnosis present

## 2012-11-08 DIAGNOSIS — K859 Acute pancreatitis without necrosis or infection, unspecified: Principal | ICD-10-CM | POA: Diagnosis present

## 2012-11-08 HISTORY — DX: Acute pancreatitis without necrosis or infection, unspecified: K85.90

## 2012-11-08 HISTORY — DX: Unspecified asthma, uncomplicated: J45.909

## 2012-11-08 LAB — BASIC METABOLIC PANEL
BUN: 16 mg/dL (ref 6–23)
Chloride: 104 mEq/L (ref 96–112)
Creatinine, Ser: 0.7 mg/dL (ref 0.50–1.10)
GFR calc Af Amer: >90 mL/min (ref >90)
Glucose, Bld: 128 mg/dL — ABNORMAL HIGH (ref 70–99)

## 2012-11-08 LAB — CBC
HCT: 42.4 % (ref 36.0–46.0)
Hemoglobin: 14.8 g/dL (ref 12.0–15.0)
MCH: 31.8 pg (ref 26.0–34.0)
MCHC: 34.9 g/dL (ref 30.0–36.0)
MCV: 91.4 fL (ref 78.0–100.0)
MCV: 91.5 fL (ref 78.0–100.0)
Platelets: 301 10*3/uL (ref 150–400)
RDW: 13.1 % (ref 11.5–15.5)
RDW: 13.1 % (ref 11.5–15.5)
WBC: 10.2 10*3/uL (ref 4.0–10.5)

## 2012-11-08 LAB — CBC WITH DIFFERENTIAL/PLATELET
Basophils Relative: 0 % (ref 0–1)
Eosinophils Absolute: 0.2 10*3/uL (ref 0.0–0.7)
Eosinophils Relative: 2 % (ref 0–5)
HCT: 39.7 % (ref 36.0–46.0)
Hemoglobin: 14 g/dL (ref 12.0–15.0)
Lymphs Abs: 2.9 10*3/uL (ref 0.7–4.0)
MCH: 32.3 pg (ref 26.0–34.0)
MCHC: 35.3 g/dL (ref 30.0–36.0)
MCV: 91.5 fL (ref 78.0–100.0)
Monocytes Absolute: 0.6 10*3/uL (ref 0.1–1.0)
Monocytes Relative: 6 % (ref 3–12)
Neutrophils Relative %: 63 % (ref 43–77)
RBC: 4.34 MIL/uL (ref 3.87–5.11)

## 2012-11-08 LAB — HEPATIC FUNCTION PANEL
ALT: 15 U/L (ref 0–35)
AST: 24 U/L (ref 0–37)
Albumin: 3.6 g/dL (ref 3.5–5.2)
Alkaline Phosphatase: 66 U/L (ref 39–117)
Total Bilirubin: 0.1 mg/dL — ABNORMAL LOW (ref 0.3–1.2)

## 2012-11-08 LAB — URINALYSIS, ROUTINE W REFLEX MICROSCOPIC
Hgb urine dipstick: NEGATIVE
Ketones, ur: 15 mg/dL — AB
Nitrite: NEGATIVE
pH: 5.5 (ref 5.0–8.0)

## 2012-11-08 MED ORDER — ONDANSETRON HCL 4 MG/2ML IJ SOLN
4.0000 mg | Freq: Once | INTRAMUSCULAR | Status: AC
  Administered 2012-11-08: 4 mg via INTRAVENOUS
  Filled 2012-11-08: qty 2

## 2012-11-08 MED ORDER — HEPARIN SODIUM (PORCINE) 5000 UNIT/ML IJ SOLN
5000.0000 [IU] | Freq: Three times a day (TID) | INTRAMUSCULAR | Status: DC
  Administered 2012-11-09 – 2012-11-12 (×12): 5000 [IU] via SUBCUTANEOUS
  Filled 2012-11-08 (×13): qty 1

## 2012-11-08 MED ORDER — HYDROMORPHONE HCL PF 1 MG/ML IJ SOLN
1.0000 mg | Freq: Once | INTRAMUSCULAR | Status: AC
  Administered 2012-11-08: 1 mg via INTRAVENOUS
  Filled 2012-11-08: qty 1

## 2012-11-08 MED ORDER — ONDANSETRON HCL 4 MG/2ML IJ SOLN
4.0000 mg | Freq: Four times a day (QID) | INTRAMUSCULAR | Status: DC | PRN
  Administered 2012-11-11 (×2): 4 mg via INTRAVENOUS
  Filled 2012-11-08 (×2): qty 2

## 2012-11-08 MED ORDER — HYDROMORPHONE HCL PF 1 MG/ML IJ SOLN: 1.0000 mg | INTRAMUSCULAR | Status: DC | PRN

## 2012-11-08 MED ORDER — SODIUM CHLORIDE 0.9 % IV SOLN
INTRAVENOUS | Status: AC
  Administered 2012-11-09: via INTRAVENOUS

## 2012-11-08 MED ORDER — MOMETASONE FURO-FORMOTEROL FUM 200-5 MCG/ACT IN AERO
2.0000 | INHALATION_SPRAY | Freq: Two times a day (BID) | RESPIRATORY_TRACT | Status: DC
  Administered 2012-11-09 – 2012-11-12 (×7): 2 via RESPIRATORY_TRACT
  Filled 2012-11-08 (×2): qty 8.8

## 2012-11-08 MED ORDER — SODIUM CHLORIDE 0.9 % IV SOLN
INTRAVENOUS | Status: DC
  Administered 2012-11-08 – 2012-11-10 (×3): via INTRAVENOUS

## 2012-11-08 MED ORDER — HYDROMORPHONE HCL PF 1 MG/ML IJ SOLN
1.0000 mg | INTRAMUSCULAR | Status: DC | PRN
  Administered 2012-11-11: 1 mg via INTRAVENOUS
  Filled 2012-11-08: qty 1

## 2012-11-08 NOTE — ED Notes (Signed)
Pt reports abd, and back pain. Also c/o n/v that started today. Denies any urinary symptoms. Reports hx of pancreatitis.

## 2012-11-08 NOTE — ED Provider Notes (Signed)
History     CSN: [id number]  Arrival date & time 11/08/12  4098   First MD Initiated Contact with Patient 11/08/12 2058      Chief Complaint  Patient presents with  . Abdominal Pain    (Consider location/radiation/quality/duration/timing/severity/associated sxs/prior treatment) HPI Complains of epigastric pain radiating to the back onset approximately 1 week ago. Accompanied by one episode of vomiting feels like pancreatitis she's had in the past presently complains of nausea. Pain is severe to with Tylenol with minimal relief. No other associated symptoms Past Medical History  Diagnosis Date  . Pancreatitis   . Asthma     Past Surgical History  Procedure Laterality Date  . Cholecystectomy      History reviewed. No pertinent family history.  History  Substance Use Topics  . Smoking status: Current Some Day Smoker  . Smokeless tobacco: Not on file  . Alcohol Use: Yes   Rare alcohol no illicit drug use OB History   Grav Para Term Preterm Abortions TAB SAB Ect Mult Living                  Review of Systems  Constitutional: Negative.   HENT: Negative.   Respiratory: Negative.   Cardiovascular: Negative.   Gastrointestinal: Positive for nausea, vomiting and abdominal pain.  Genitourinary:       Postmenopausal  Musculoskeletal: Negative.   Skin: Negative.   Neurological: Negative.   Psychiatric/Behavioral: Negative.   All other systems reviewed and are negative.    Allergies  Codeine  Home Medications   Current Outpatient Rx  Name  Route  Sig  Dispense  Refill  . Fluticasone-Salmeterol (ADVAIR) 500-50 MCG/DOSE AEPB   Inhalation   Inhale 1 puff into the lungs every 12 (twelve) hours.           BP 189/114  Pulse 97  Temp(Src) 97.9 F (36.6 C) (Oral)  SpO2 97%  Physical Exam  Nursing note and vitals reviewed. Constitutional: She appears well-developed and well-nourished.  HENT:  Head: Normocephalic and atraumatic.  Eyes: Conjunctivae are  normal. Pupils are equal, round, and reactive to light.  Neck: Neck supple. No tracheal deviation present. No thyromegaly present.  Cardiovascular: Normal rate and regular rhythm.   No murmur heard. Pulmonary/Chest: Effort normal and breath sounds normal.  Abdominal: Soft. Bowel sounds are normal. She exhibits no distension. There is tenderness.  Tender at epigastrium and right upper quadrant left upper quadrant no guarding rigidity or rebound  Musculoskeletal: Normal range of motion. She exhibits no edema and no tenderness.  Neurological: She is alert. Coordination normal.  Skin: Skin is warm and dry. No rash noted.  Psychiatric: She has a normal mood and affect.    ED Course  Procedures (including critical care time)  Labs Reviewed  LIPASE, BLOOD - Abnormal; Notable for the following:    Lipase 192 (*)    All other components within normal limits  BASIC METABOLIC PANEL - Abnormal; Notable for the following:    Glucose, Bld 128 (*)    All other components within normal limits  URINALYSIS, ROUTINE W REFLEX MICROSCOPIC - Abnormal; Notable for the following:    Color, Urine AMBER (*)    Specific Gravity, Urine 1.039 (*)    Bilirubin Urine SMALL (*)    Ketones, ur 15 (*)    All other components within normal limits  CBC   No results found.   No diagnosis found.  Results for orders placed during the hospital encounter of 11/08/12  LIPASE, BLOOD      Result Value Range   Lipase 192 (*) 11 - 59 U/L  CBC      Result Value Range   WBC 10.2  4.0 - 10.5 K/uL   RBC 4.64  3.87 - 5.11 MIL/uL   Hemoglobin 14.8  12.0 - 15.0 g/dL   HCT 16.1  09.6 - 04.5 %   MCV 91.4  78.0 - 100.0 fL   MCH 31.9  26.0 - 34.0 pg   MCHC 34.9  30.0 - 36.0 g/dL   RDW 40.9  81.1 - 91.4 %   Platelets 351  150 - 400 K/uL  BASIC METABOLIC PANEL      Result Value Range   Sodium 140  135 - 145 mEq/L   Potassium 4.1  3.5 - 5.1 mEq/L   Chloride 104  96 - 112 mEq/L   CO2 24  19 - 32 mEq/L   Glucose, Bld 128  (*) 70 - 99 mg/dL   BUN 16  6 - 23 mg/dL   Creatinine, Ser 7.82  0.50 - 1.10 mg/dL   Calcium 9.4  8.4 - 95.6 mg/dL   GFR calc non Af Amer >90  >90 mL/min   GFR calc Af Amer >90  >90 mL/min  URINALYSIS, ROUTINE W REFLEX MICROSCOPIC      Result Value Range   Color, Urine AMBER (*) YELLOW   APPearance CLEAR  CLEAR   Specific Gravity, Urine 1.039 (*) 1.005 - 1.030   pH 5.5  5.0 - 8.0   Glucose, UA NEGATIVE  NEGATIVE mg/dL   Hgb urine dipstick NEGATIVE  NEGATIVE   Bilirubin Urine SMALL (*) NEGATIVE   Ketones, ur 15 (*) NEGATIVE mg/dL   Protein, ur NEGATIVE  NEGATIVE mg/dL   Urobilinogen, UA 1.0  0.0 - 1.0 mg/dL   Nitrite NEGATIVE  NEGATIVE   Leukocytes, UA NEGATIVE  NEGATIVE   Results for orders placed during the hospital encounter of 11/08/12  LIPASE, BLOOD      Result Value Range   Lipase 192 (*) 11 - 59 U/L  CBC      Result Value Range   WBC 10.2  4.0 - 10.5 K/uL   RBC 4.64  3.87 - 5.11 MIL/uL   Hemoglobin 14.8  12.0 - 15.0 g/dL   HCT 21.3  08.6 - 57.8 %   MCV 91.4  78.0 - 100.0 fL   MCH 31.9  26.0 - 34.0 pg   MCHC 34.9  30.0 - 36.0 g/dL   RDW 46.9  62.9 - 52.8 %   Platelets 351  150 - 400 K/uL  BASIC METABOLIC PANEL      Result Value Range   Sodium 140  135 - 145 mEq/L   Potassium 4.1  3.5 - 5.1 mEq/L   Chloride 104  96 - 112 mEq/L   CO2 24  19 - 32 mEq/L   Glucose, Bld 128 (*) 70 - 99 mg/dL   BUN 16  6 - 23 mg/dL   Creatinine, Ser 4.13  0.50 - 1.10 mg/dL   Calcium 9.4  8.4 - 24.4 mg/dL   GFR calc non Af Amer >90  >90 mL/min   GFR calc Af Amer >90  >90 mL/min  URINALYSIS, ROUTINE W REFLEX MICROSCOPIC      Result Value Range   Color, Urine AMBER (*) YELLOW   APPearance CLEAR  CLEAR   Specific Gravity, Urine 1.039 (*) 1.005 - 1.030   pH 5.5  5.0 - 8.0  Glucose, UA NEGATIVE  NEGATIVE mg/dL   Hgb urine dipstick NEGATIVE  NEGATIVE   Bilirubin Urine SMALL (*) NEGATIVE   Ketones, ur 15 (*) NEGATIVE mg/dL   Protein, ur NEGATIVE  NEGATIVE mg/dL   Urobilinogen, UA 1.0   0.0 - 1.0 mg/dL   Nitrite NEGATIVE  NEGATIVE   Leukocytes, UA NEGATIVE  NEGATIVE  CBC WITH DIFFERENTIAL      Result Value Range   WBC 10.0  4.0 - 10.5 K/uL   RBC 4.34  3.87 - 5.11 MIL/uL   Hemoglobin 14.0  12.0 - 15.0 g/dL   HCT 16.1  09.6 - 04.5 %   MCV 91.5  78.0 - 100.0 fL   MCH 32.3  26.0 - 34.0 pg   MCHC 35.3  30.0 - 36.0 g/dL   RDW 40.9  81.1 - 91.4 %   Platelets 324  150 - 400 K/uL   Neutrophils Relative 63  43 - 77 %   Neutro Abs 6.3  1.7 - 7.7 K/uL   Lymphocytes Relative 29  12 - 46 %   Lymphs Abs 2.9  0.7 - 4.0 K/uL   Monocytes Relative 6  3 - 12 %   Monocytes Absolute 0.6  0.1 - 1.0 K/uL   Eosinophils Relative 2  0 - 5 %   Eosinophils Absolute 0.2  0.0 - 0.7 K/uL   Basophils Relative 0  0 - 1 %   Basophils Absolute 0.0  0.0 - 0.1 K/uL   No results found.  No results found. 10:23 PM pain improved after treatment with intravenous hydromorphone. Patient appears more comfortable  MDM  Case discussed with Dr. Julian Reil Plan medical surgical floor.IV fluids bowel rest Diagnosis acute pancreatitis        Doug Sou, MD 11/08/12 2225

## 2012-11-08 NOTE — H&P (Addendum)
[facility] History and Physical  Melinda [name] ZOX:[id number] DOB: [birth year] DOA: 11/08/2012  Referring physician: ED PCP: Malvin Johns, PT  Specialists: None  Chief Complaint: Abdominal pain  HPI: Melinda [name] is a 52 y.o. female who presents with c/o epigastric abdominal pain radiating to her back.  This episode onset approximently 1 week ago but she has had off and on abdominal pain about 3 days a week for the past 6 months and frequently since her bout of pancreatitis in 2012.  Due to lack of insurance she does not routinely see a physician and has not sought medical treatment until today for her ongoing abdominal pain.  Her abdominal pain is worse with eating better when not eating, tylenol provided only minimal relief.  In the ED she was found to have an elevated lipase in the 190s, hospitalist has been asked to admit for pancreatitis.  Review of Systems: Patient notes associated nausea, 1 episode of vomiting, very rare EtOH use (once every 3 weekends or so due to it making her abdominal pain worse), no history of being on DDI, any DM2 medications, no history of scorpion sting, gall bladder removed after gallstones caused pancreatitis in 2012. 12 systems reviewed and otherwise negative.  Past Medical History  Diagnosis Date  . Pancreatitis   . Asthma    Past Surgical History  Procedure Laterality Date  . Cholecystectomy     Social History:  reports that she has been smoking.  She does not have any smokeless tobacco history on file. She reports that  drinks alcohol. She reports that she does not use illicit drugs.   Allergies  Allergen Reactions  . Codeine Itching and Nausea Only    History reviewed. No pertinent family history. No h/o pancreatitis.  Prior to Admission medications   Medication Sig Start Date End Date Taking? Authorizing Provider  Fluticasone-Salmeterol (ADVAIR) 500-50 MCG/DOSE AEPB Inhale 1 puff into the lungs every 12 (twelve) hours.   Yes Historical  Provider, MD   Physical Exam: Filed Vitals:   11/08/12 2200 11/08/12 2215 11/08/12 2230 11/08/12 2245  BP:  144/87 144/77 145/72  Pulse: 89 83 83 86  Temp:      TempSrc:      Resp: 18 12 13 23   SpO2: 98% 97% 96% 98%     General:  NAD, has obvious abdominal pain  Eyes: PEERLA EOMI  ENT: mucous membranes moist  Neck: supple w/o JVD  Cardiovascular: RRR w/o MRG  Respiratory: CTA B  Abdomen: soft, epigastric tenderness, no rebound, no guarding, nd, bs+  Skin: no rash nor lesion  Musculoskeletal: MAE, full ROM all 4 extremities  Psychiatric: normal tone and affect  Neurologic: AAOx3, grossly non-focal  Labs on Admission:  Basic Metabolic Panel:  Recent Labs Lab 11/08/12 1859  NA 140  K 4.1  CL 104  CO2 24  GLUCOSE 128*  BUN 16  CREATININE 0.70  CALCIUM 9.4   Liver Function Tests: No results found for this basename: AST, ALT, ALKPHOS, BILITOT, PROT, ALBUMIN,  in the last 168 hours  Recent Labs Lab 11/08/12 1859  LIPASE 192*   No results found for this basename: AMMONIA,  in the last 168 hours CBC:  Recent Labs Lab 11/08/12 1859 11/08/12 2147  WBC 10.2 10.0  NEUTROABS  --  6.3  HGB 14.8 14.0  HCT 42.4 39.7  MCV 91.4 91.5  PLT 351 324   Cardiac Enzymes: No results found for this basename: CKTOTAL, CKMB, CKMBINDEX, TROPONINI,  in the last 168  hours  BNP (last 3 results) No results found for this basename: PROBNP,  in the last 8760 hours CBG: No results found for this basename: GLUCAP,  in the last 168 hours  Radiological Exams on Admission: No results found.  EKG: Independently reviewed.  Assessment/Plan Principal Problem:   Pancreatitis Active Problems:   Elevated blood pressure   1. Pancreatitis - abdominal ultrasound ordered and pending, liver enzymes ordered and pending, while this may represent a retained stone, this seems somewhat less likely given the history.  More likely is that the patient has chronic pancreatitis not  entirely resolved since her 2012 episode of pancreatitis.  Will see if there is a pseudocyst on ultrasound.  Also checking lipid panel in AM to make sure we arnt missing hypertriglyceridemia as the cause for her pancreatitis.  No SIRS criteria so no indication for ABx at this point.  Keeping patient NPO except ice chips, IVF at 125 cc/hr, pain control with dilaudid PRN. 2. Elevated blood pressure - monitor for now, possibly due to pain vs undiagnosed HTN. 3. Asthma - continue home advair    Code Status: Full code (must indicate code status--if unknown or must be presumed, indicate so) Family Communication: Spoke with husband at bedside (indicate person spoken with, if applicable, with phone number if by telephone) Disposition Plan: Admit to inpatient (indicate anticipated LOS)  Time spent: 70 min  GARDNER, JARED M. [facility] Pager [phone]  If 7PM-7AM, please contact night-coverage [email or web address] Password [facility] 11/08/2012, 10:55 PM

## 2012-11-09 ENCOUNTER — Inpatient Hospital Stay (HOSPITAL_COMMUNITY): Payer: Self-pay

## 2012-11-09 ENCOUNTER — Encounter (HOSPITAL_COMMUNITY): Payer: Self-pay | Admitting: Radiology

## 2012-11-09 DIAGNOSIS — K863 Pseudocyst of pancreas: Secondary | ICD-10-CM

## 2012-11-09 DIAGNOSIS — K862 Cyst of pancreas: Secondary | ICD-10-CM

## 2012-11-09 LAB — LIPID PANEL
LDL Cholesterol: 73 mg/dL (ref 0–99)
Total CHOL/HDL Ratio: 4.2 RATIO
VLDL: 33 mg/dL (ref 0–40)

## 2012-11-09 LAB — CBC
HCT: 37.4 % (ref 36.0–46.0)
MCHC: 33.2 g/dL (ref 30.0–36.0)
MCV: 92.8 fL (ref 78.0–100.0)
RDW: 13.4 % (ref 11.5–15.5)
WBC: 6.3 10*3/uL (ref 4.0–10.5)

## 2012-11-09 LAB — GLUCOSE, CAPILLARY: Glucose-Capillary: 88 mg/dL (ref 70–99)

## 2012-11-09 LAB — HEPATIC FUNCTION PANEL
AST: 20 U/L (ref 0–37)
Albumin: 3.6 g/dL (ref 3.5–5.2)
Total Protein: 6.6 g/dL (ref 6.0–8.3)

## 2012-11-09 LAB — BASIC METABOLIC PANEL
BUN: 17 mg/dL (ref 6–23)
Chloride: 107 mEq/L (ref 96–112)
Creatinine, Ser: 0.79 mg/dL (ref 0.50–1.10)
GFR calc Af Amer: >90 mL/min (ref >90)

## 2012-11-09 LAB — LIPASE, BLOOD: Lipase: 88 U/L — ABNORMAL HIGH (ref 11–59)

## 2012-11-09 MED ORDER — IOHEXOL 300 MG/ML  SOLN
100.0000 mL | Freq: Once | INTRAMUSCULAR | Status: AC | PRN
  Administered 2012-11-09: 100 mL via INTRAVENOUS

## 2012-11-09 NOTE — Progress Notes (Signed)
INITIAL NUTRITION ASSESSMENT  DOCUMENTATION CODES Per approved criteria  -Obesity Unspecified   INTERVENTION: Supplement diet as appropraite  NUTRITION DIAGNOSIS: Inadequate oral intake related to abdominal pain as evidenced by 19% weight loss x 1 1/2 years.   Goal: Pt to meet >/= 90% of their estimated nutrition needs.   Monitor:  Pt's ability to tolerate diet advancement, PO intake, weight  Reason for Assessment: Positive Malnutrition Screening Tool  52 y.o. female  Admitting Dx: Pancreatitis  ASSESSMENT: Pt admitted and c/o epigastric abdominal pain radiating to her back. This episode onset approximently 1 week ago but she has had off and on abdominal pain about 3 days a week for the past 6 months and frequently since her bout of pancreatitis in 2012. Per pt abd pain worse with eating.  Per pt about 6 months after her previous Pancreatitis she started to have abd pain. It was 3-5 days out of the month and progressed to now about 5 days a week she has pain. She started eating less due to pain resulting in about 41 lb weight loss x 1 1/2 years.  Work up ongoing.   Height: Ht Readings from Last 1 Encounters:  11/09/12 5' 2.5" (1.588 m)    Weight: Wt Readings from Last 1 Encounters:  11/09/12 175 lb 7.8 oz (79.6 kg)    Ideal Body Weight: 51.1 kg  % Ideal Body Weight: 156%  Wt Readings from Last 10 Encounters:  11/09/12 175 lb 7.8 oz (79.6 kg)    Usual Body Weight: 216 lb 1 1/2 years ago  % Usual Body Weight: 81%  BMI:  Body mass index is 31.57 kg/(m^2).  Obesity Class I   Estimated Nutritional Needs: Kcal: 1600-1800 Protein: 80-90 grams Fluid: > 1.6 L/day  Skin: no issues noted  Diet Order: NPO  EDUCATION NEEDS: -No education needs identified at this time  No intake or output data in the 24 hours ending 11/09/12 0930  Last BM: PTA   Labs:   Recent Labs Lab 11/08/12 1859 11/08/12 2254 11/09/12 0625  NA 140  --  141  K 4.1  --  3.6  CL 104   --  107  CO2 24  --  25  BUN 16  --  17  CREATININE 0.70 0.67 0.79  CALCIUM 9.4  --  8.7  GLUCOSE 128*  --  100*    CBG (last 3)   Recent Labs  11/09/12 0803  GLUCAP 88    Scheduled Meds: . sodium chloride   Intravenous STAT  . heparin  5,000 Units Subcutaneous Q8H  . mometasone-formoterol  2 puff Inhalation BID    Continuous Infusions: . sodium chloride 125 mL/hr at 11/08/12 2150    Past Medical History  Diagnosis Date  . Pancreatitis   . Asthma     Past Surgical History  Procedure Laterality Date  . Cholecystectomy      Kendell Bane RD, LDN, CNSC [phone] Pager [phone] After Hours Pager

## 2012-11-09 NOTE — Consult Note (Signed)
Reason for Consult:Pancreatic cyst Referring Physician: Dr. Wallene [name] Melinda [name] is an 52 y.o. female.  HPI: She is status post laparoscopic cholecystectomy for gallstone pancreatitis back in 2012. She reports she did well for approximately 8 months but since then has intermittent epigastric abdominal pain. Most recently she has had [name] week of epigastric pain hurting through to the back.   She has had nausea as well as one episode of emesis. The pain is now sharp and constant. Bowel movements have been normal. She is otherwise without complaints.  Past Medical History  Diagnosis Date  . Pancreatitis   . Asthma     Past Surgical History  Procedure Laterality Date  . Cholecystectomy      History reviewed. No pertinent family history.  Social History:  reports that she has been smoking.  She does not have any smokeless tobacco history on file. She reports that  drinks alcohol. She reports that she does not use illicit drugs.  Allergies:  Allergies  Allergen Reactions  . Codeine Itching and Nausea Only    Medications: I have reviewed the patient's current medications.  Results for orders placed during the hospital encounter of 11/08/12 (from the past 48 hour(s))  LIPASE, BLOOD     Status: Abnormal   Collection Time    11/08/12  6:59 PM      Result Value Range   Lipase 192 (*) 11 - 59 U/L  CBC     Status: None   Collection Time    11/08/12  6:59 PM      Result Value Range   WBC 10.2  4.0 - 10.5 K/uL   RBC 4.64  3.87 - 5.11 MIL/uL   Hemoglobin 14.8  12.0 - 15.0 g/dL   HCT 45.4  09.8 - 11.9 %   MCV 91.4  78.0 - 100.0 fL   MCH 31.9  26.0 - 34.0 pg   MCHC 34.9  30.0 - 36.0 g/dL   RDW 14.7  82.9 - 56.2 %   Platelets 351  150 - 400 K/uL  BASIC METABOLIC PANEL     Status: Abnormal   Collection Time    11/08/12  6:59 PM      Result Value Range   Sodium 140  135 - 145 mEq/L   Potassium 4.1  3.5 - 5.1 mEq/L   Chloride 104  96 - 112 mEq/L   CO2 24  19 - 32 mEq/L   Glucose,  Bld 128 (*) 70 - 99 mg/dL   BUN 16  6 - 23 mg/dL   Creatinine, Ser 1.30  0.50 - 1.10 mg/dL   Calcium 9.4  8.4 - 86.5 mg/dL   GFR calc non Af Amer >90  >90 mL/min   GFR calc Af Amer >90  >90 mL/min   Comment:            The eGFR has been calculated     using the CKD EPI equation.     This calculation has not been     validated in all clinical     situations.     eGFR's persistently     <90 mL/min signify     possible Chronic Kidney Disease.  URINALYSIS, ROUTINE W REFLEX MICROSCOPIC     Status: Abnormal   Collection Time    11/08/12  7:09 PM      Result Value Range   Color, Urine AMBER (*) YELLOW   Comment: BIOCHEMICALS MAY BE AFFECTED BY COLOR   APPearance  CLEAR  CLEAR   Specific Gravity, Urine 1.039 (*) 1.005 - 1.030   pH 5.5  5.0 - 8.0   Glucose, UA NEGATIVE  NEGATIVE mg/dL   Hgb urine dipstick NEGATIVE  NEGATIVE   Bilirubin Urine SMALL (*) NEGATIVE   Ketones, ur 15 (*) NEGATIVE mg/dL   Protein, ur NEGATIVE  NEGATIVE mg/dL   Urobilinogen, UA 1.0  0.0 - 1.0 mg/dL   Nitrite NEGATIVE  NEGATIVE   Leukocytes, UA NEGATIVE  NEGATIVE   Comment: MICROSCOPIC NOT DONE ON URINES WITH NEGATIVE PROTEIN, BLOOD, LEUKOCYTES, NITRITE, OR GLUCOSE <1000 mg/dL.  CBC WITH DIFFERENTIAL     Status: None   Collection Time    11/08/12  9:47 PM      Result Value Range   WBC 10.0  4.0 - 10.5 K/uL   RBC 4.34  3.87 - 5.11 MIL/uL   Hemoglobin 14.0  12.0 - 15.0 g/dL   HCT 16.1  09.6 - 04.5 %   MCV 91.5  78.0 - 100.0 fL   MCH 32.3  26.0 - 34.0 pg   MCHC 35.3  30.0 - 36.0 g/dL   RDW 40.9  81.1 - 91.4 %   Platelets 324  150 - 400 K/uL   Neutrophils Relative 63  43 - 77 %   Neutro Abs 6.3  1.7 - 7.7 K/uL   Lymphocytes Relative 29  12 - 46 %   Lymphs Abs 2.9  0.7 - 4.0 K/uL   Monocytes Relative 6  3 - 12 %   Monocytes Absolute 0.6  0.1 - 1.0 K/uL   Eosinophils Relative 2  0 - 5 %   Eosinophils Absolute 0.2  0.0 - 0.7 K/uL   Basophils Relative 0  0 - 1 %   Basophils Absolute 0.0  0.0 - 0.1 K/uL   HEPATIC FUNCTION PANEL     Status: Abnormal   Collection Time    11/08/12 10:54 PM      Result Value Range   Total Protein 7.0  6.0 - 8.3 g/dL   Albumin 3.6  3.5 - 5.2 g/dL   AST 24  0 - 37 U/L   ALT 15  0 - 35 U/L   Alkaline Phosphatase 66  39 - 117 U/L   Total Bilirubin 0.1 (*) 0.3 - 1.2 mg/dL   Bilirubin, Direct <7.8  0.0 - 0.3 mg/dL   Indirect Bilirubin NOT CALCULATED  0.3 - 0.9 mg/dL  CBC     Status: None   Collection Time    11/08/12 10:54 PM      Result Value Range   WBC 8.5  4.0 - 10.5 K/uL   RBC 4.24  3.87 - 5.11 MIL/uL   Hemoglobin 13.5  12.0 - 15.0 g/dL   HCT 29.5  62.1 - 30.8 %   MCV 91.5  78.0 - 100.0 fL   MCH 31.8  26.0 - 34.0 pg   MCHC 34.8  30.0 - 36.0 g/dL   RDW 65.7  84.6 - 96.2 %   Platelets 301  150 - 400 K/uL  CREATININE, SERUM     Status: None   Collection Time    11/08/12 10:54 PM      Result Value Range   Creatinine, Ser 0.67  0.50 - 1.10 mg/dL   GFR calc non Af Amer >90  >90 mL/min   GFR calc Af Amer >90  >90 mL/min   Comment:            The eGFR has  been calculated     using the CKD EPI equation.     This calculation has not been     validated in all clinical     situations.     eGFR's persistently     <90 mL/min signify     possible Chronic Kidney Disease.  CBC     Status: None   Collection Time    11/09/12  6:25 AM      Result Value Range   WBC 6.3  4.0 - 10.5 K/uL   RBC 4.03  3.87 - 5.11 MIL/uL   Hemoglobin 12.4  12.0 - 15.0 g/dL   HCT 21.3  08.6 - 57.8 %   MCV 92.8  78.0 - 100.0 fL   MCH 30.8  26.0 - 34.0 pg   MCHC 33.2  30.0 - 36.0 g/dL   RDW 46.9  62.9 - 52.8 %   Platelets 252  150 - 400 K/uL  BASIC METABOLIC PANEL     Status: Abnormal   Collection Time    11/09/12  6:25 AM      Result Value Range   Sodium 141  135 - 145 mEq/L   Potassium 3.6  3.5 - 5.1 mEq/L   Chloride 107  96 - 112 mEq/L   CO2 25  19 - 32 mEq/L   Glucose, Bld 100 (*) 70 - 99 mg/dL   BUN 17  6 - 23 mg/dL   Creatinine, Ser 4.13  0.50 - 1.10 mg/dL    Calcium 8.7  8.4 - 24.4 mg/dL   GFR calc non Af Amer >90  >90 mL/min   GFR calc Af Amer >90  >90 mL/min   Comment:            The eGFR has been calculated     using the CKD EPI equation.     This calculation has not been     validated in all clinical     situations.     eGFR's persistently     <90 mL/min signify     possible Chronic Kidney Disease.  LIPID PANEL     Status: Abnormal   Collection Time    11/09/12  6:25 AM      Result Value Range   Cholesterol 139  0 - 200 mg/dL   Triglycerides 010 (*) <150 mg/dL   HDL 33 (*) >27 mg/dL   Total CHOL/HDL Ratio 4.2     VLDL 33  0 - 40 mg/dL   LDL Cholesterol 73  0 - 99 mg/dL   Comment:            Total Cholesterol/HDL:CHD Risk     Coronary Heart Disease Risk Table                         Men   Women      1/2 Average Risk   3.4   3.3      Average Risk       5.0   4.4      2 X Average Risk   9.6   7.1      3 X Average Risk  23.4   11.0                Use the calculated Patient Ratio     above and the CHD Risk Table     to determine the patient's CHD Risk.  ATP III CLASSIFICATION (LDL):      <100     mg/dL   Optimal      161-096  mg/dL   Near or Above                        Optimal      130-159  mg/dL   Borderline      045-409  mg/dL   High      >811     mg/dL   Very High  GLUCOSE, CAPILLARY     Status: None   Collection Time    11/09/12  8:03 AM      Result Value Range   Glucose-Capillary 88  70 - 99 mg/dL    US Abdomen Complete  11/08/2012  *RADIOLOGY REPORT*  Clinical Data:  Epigastric abdominal pain, history of chronic pancreatitis  COMPLETE ABDOMINAL ULTRASOUND  Comparison:  12/04/2010 CT  Findings:  Gallbladder:  Absent  Common bile duct:  Normal diameter of 4 mm.  Liver:  No focal lesion identified.  Within normal limits in parenchymal echogenicity.  IVC:  Appears normal.  Pancreas:  Unilocular fluid collection along the head of the pancreas with mild rim thickening and anechoic internal fluid, measures  5.1 cm.  Spleen:  Measures 5 cm oblique.  No focal abnormality.  Right Kidney:  Measures 10.1 cm.  No hydronephrosis or focal abnormality peri  Left Kidney:  Measures 9.2 cm.  No hydronephrosis or focal abnormality.  Abdominal aorta:  No aneurysm identified.  IMPRESSION: 5.1 cm fluid collection along the head of the pancreas. Consider contrast enhanced cross-sectional imaging.   Original Report Authenticated By: Jearld Lesch, M.D.     Review of Systems  All other systems reviewed and are negative.   Blood pressure 155/68, pulse 67, temperature 98.5 F (36.9 C), temperature source Oral, resp. rate 16, height 5' 2.5" (1.588 m), weight 175 lb 7.8 oz (79.6 kg), SpO2 96.00%. Physical Exam  Constitutional: She is oriented to person, place, and time. She appears well-developed and well-nourished. No distress.  HENT:  Head: Normocephalic and atraumatic.  Right Ear: External ear normal.  Left Ear: External ear normal.  Nose: Nose normal.  Mouth/Throat: No oropharyngeal exudate.  Eyes: Conjunctivae are normal. Pupils are equal, round, and reactive to light. Right eye exhibits no discharge. Left eye exhibits no discharge. No scleral icterus.  Neck: Neck supple. No tracheal deviation present. No thyromegaly present.  Cardiovascular: Normal rate, regular rhythm, normal heart sounds and intact distal pulses.   No murmur heard. Respiratory: Effort normal and breath sounds normal. No respiratory distress. She has no wheezes.  GI: Soft. Bowel sounds are normal. She exhibits no distension. There is tenderness. There is guarding. There is no rebound.  There is mild epigastric tenderness with guarding  Musculoskeletal: Normal range of motion. She exhibits no edema and no tenderness.  Lymphadenopathy:    She has no cervical adenopathy.  Neurological: She is alert and oriented to person, place, and time.  Skin: Skin is warm and dry. No rash noted. No erythema.  Psychiatric: Her behavior is normal.  Judgment normal.    Assessment/Plan: Pancreatitis with possible pancreatic pseudocyst  The plan will be to continue bowel rest and IV rehydration and get [name] CAT scan of the abdomen and pelvis to better assess the fluid collection the pancreas. Hopefully, this will resolve without need for surgical intervention. She may also need an MRCP versus  ERCP to evaluate the pancreatic  duct. We will follow her with you.  Melinda [name] 11/09/2012, 3:21 PM

## 2012-11-09 NOTE — Progress Notes (Signed)
PROGRESS NOTE  Melinda [name] QMV:[id number] DOB: [birth year] DOA: 11/08/2012 PCP: Malvin Johns, PT  Brief narrative: 52 year old female admitted with pancreatitis and 11/09/2012  Past medical history-As per Problem list Chart reviewed as below-  Admission 3.17.12 for biliary pancreatitis s/p Lap chole 3.22.12  Consultants:  None currently  Procedures:  Abdominal ultrasound to 11/08/12 = 5.1 cm fluid collection along her pancreas consider contrast enhanced cross-sectional  Antibiotics:  None currently   Subjective  Doing well since NPO.  States no n/v right now. States abd pain started back maybe about 6 mo ago, worsened with eating Not relieved by anything,  Epigastric, radiating to the back No noted diarrhea or dark stools.   Objective    Interim History: nad  Telemetry: nad  Objective: Filed Vitals:   11/09/12 0256 11/09/12 0617 11/09/12 0700 11/09/12 1032  BP: 145/69 137/65  139/75  Pulse: 72 66  64  Temp: 97.7 F (36.5 C) 98 F (36.7 C)  98.3 F (36.8 C)  TempSrc: Oral Oral  Oral  Resp: 20 20  18   Height:      Weight:      SpO2: 98% 97% 96% 98%   No intake or output data in the 24 hours ending 11/09/12 1343  Exam:  General: alert pleasant oriented CF Cardiovascular: s1 s 2 no m/r/g Respiratory: clear Abdomen: soft, BS somewhat decreased.  Epigastric area is tender.  NO significant distension Skin no ict.  NO rash Neuro gorssly intact  Data Reviewed: Basic Metabolic Panel:  Recent Labs Lab 11/08/12 1859 11/08/12 2254 11/09/12 0625  NA 140  --  141  K 4.1  --  3.6  CL 104  --  107  CO2 24  --  25  GLUCOSE 128*  --  100*  BUN 16  --  17  CREATININE 0.70 0.67 0.79  CALCIUM 9.4  --  8.7   Liver Function Tests:  Recent Labs Lab 11/08/12 2254  AST 24  ALT 15  ALKPHOS 66  BILITOT 0.1*  PROT 7.0  ALBUMIN 3.6    Recent Labs Lab 11/08/12 1859  LIPASE 192*   No results found for this basename: AMMONIA,  in the last 168  hours CBC:  Recent Labs Lab 11/08/12 1859 11/08/12 2147 11/08/12 2254 11/09/12 0625  WBC 10.2 10.0 8.5 6.3  NEUTROABS  --  6.3  --   --   HGB 14.8 14.0 13.5 12.4  HCT 42.4 39.7 38.8 37.4  MCV 91.4 91.5 91.5 92.8  PLT 351 324 301 252   Cardiac Enzymes: No results found for this basename: CKTOTAL, CKMB, CKMBINDEX, TROPONINI,  in the last 168 hours BNP: No components found with this basename: POCBNP,  CBG:  Recent Labs Lab 11/09/12 0803  GLUCAP 88    No results found for this or any previous visit (from the past 240 hour(s)).   Studies:              All Imaging reviewed and is as per above notation   Scheduled Meds: . heparin  5,000 Units Subcutaneous Q8H  . mometasone-formoterol  2 puff Inhalation BID   Continuous Infusions: . sodium chloride 125 mL/hr at 11/08/12 2150     Assessment/Plan: 1. Potential pancreatitis-LFT's and Lipase ordered for today.  Will allow bowel rest.  Consideration for EUS vs ERCP/MRCP might be needed-Triglycerides not markedly elevated, so unlikely 2/2 to hyper TG-Will consult GI for recommendations.  NPO, IVF NS 125 cc/hr for now. 2. 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 H/o Asthma-stable currently 4. Moderately elevated blood pressure-monitor.  Implement meds if prn  Code Status: FUll Family Communication: Discussed with mothe rnad father at bedside Disposition Plan: inpatient   Pleas Koch, MD  [facility] Pager [phone] 11/09/2012, 1:43 PM    LOS: 1 day

## 2012-11-10 DIAGNOSIS — K862 Cyst of pancreas: Secondary | ICD-10-CM | POA: Diagnosis present

## 2012-11-10 LAB — COMPREHENSIVE METABOLIC PANEL
ALT: 17 U/L (ref 0–35)
Alkaline Phosphatase: 64 U/L (ref 39–117)
CO2: 23 mEq/L (ref 19–32)
Chloride: 100 mEq/L (ref 96–112)
GFR calc Af Amer: >90 mL/min (ref >90)
GFR calc non Af Amer: >90 mL/min (ref >90)
Glucose, Bld: 79 mg/dL (ref 70–99)
Potassium: 3.4 mEq/L — ABNORMAL LOW (ref 3.5–5.1)
Sodium: 134 mEq/L — ABNORMAL LOW (ref 135–145)
Total Bilirubin: 0.3 mg/dL (ref 0.3–1.2)
Total Protein: 6.3 g/dL (ref 6.0–8.3)

## 2012-11-10 LAB — CBC
HCT: 36 % (ref 36.0–46.0)
MCHC: 34.2 g/dL (ref 30.0–36.0)
Platelets: 249 10*3/uL (ref 150–400)
RDW: 12.9 % (ref 11.5–15.5)

## 2012-11-10 LAB — GLUCOSE, CAPILLARY

## 2012-11-10 NOTE — Progress Notes (Signed)
Subjective: Feels a little better. Some intermittent nausea. No emesis. A little flatus. Still a little sore in upper abd but better  Objective: Vital signs in last 24 hours: Temp:  [97.6 F (36.4 C)-98.5 F (36.9 C)] 97.6 F (36.4 C) (02/22 0644) Pulse Rate:  [60-72] 60 (02/22 0644) Resp:  [16-18] 18 (02/22 0644) BP: (150-166)/(62-71) 153/62 mmHg (02/22 0644) SpO2:  [96 %-99 %] 98 % (02/22 0819) Last BM Date: 11/08/12  Intake/Output from previous day:   Intake/Output this shift:    Alert, sitting in chair cta Soft, mild epigastric TTP. No rt/guarding. ND  Lab Results:   Recent Labs  11/09/12 0625 11/10/12 0618  WBC 6.3 7.0  HGB 12.4 12.3  HCT 37.4 36.0  PLT 252 249   BMET  Recent Labs  11/09/12 0625 11/10/12 0618  NA 141 134*  K 3.6 3.4*  CL 107 100  CO2 25 23  GLUCOSE 100* 79  BUN 17 8  CREATININE 0.79 0.65  CALCIUM 8.7 9.0   PT/INR No results found for this basename: LABPROT, INR,  in the last 72 hours ABG No results found for this basename: PHART, PCO2, PO2, HCO3,  in the last 72 hours  Studies/Results: Ct Pelvis W Contrast  11/09/2012  *RADIOLOGY REPORT*  Clinical Data: Evaluate pancreatic pseudocyst.  CT ABDOMEN WITHOUT AND WITH CONTRAST,CT PELVIS WITH CONTRAST  Technique:  Multidetector CT imaging of the abdomen was performed following the standard protocol before and during bolus administration of intravenous contrast.,Technique:  Multidetector CT imaging of the pelvis was performed following the standard proto  Contrast: OMNIPAQUE IOHEXOL 300 MG/ML  SOLN  Comparison: CT scan 12/04/2010.  Findings: There is a large cyst in the pancreatic head which measures as simple fluid.  Appears to extend into the lesser sac and has mass effect on the antral pyloric region of the stomach. It measures 5.9 x 4.0 cm and is likely causing mass effect on the main pancreatic duct which is dilated in the pancreatic body and tail and appears be mild atrophy of  the pancreas.  Drainage should be considered.  No worrisome enhancing elements to suggest this is a mass.  Other of the mass effect on the duodenum and stomach the peripancreatic tissues are unremarkable.  There are mildly enlarged gastrohepatic, periportal and celiac axis lymph nodes which are likely inflammatory.  The liver is unremarkable.  No focal hepatic lesions or intrahepatic biliary dilatation.  The gallbladder is surgically absent.  The spleen is normal in size.  No focal lesions.  The adrenal glands and kidneys are normal and stable.  The common bile duct is normal in caliber.  The stomach, duodenum, small bowel and colon unremarkable.  No mesenteric or retroperitoneal adenopathy.  The aorta is normal in caliber.  The major branch vessels are normal.  The uterus and ovaries are normal.  No pelvic mass, adenopathy or free pelvic fluid collections.  The bladder is normal.  No inguinal mass or hernia.  The bony structures are unremarkable.  IMPRESSION:  1.  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  The pseudocyst is likely compressing the main pancreatic duct. 2.  Borderline enlarged upper abdominal lymph nodes likely inflammatory/hyperplastic. 3.  Status post cholecystectomy with normal common bile duct caliber.   Original Report Authenticated By: Rudie Meyer, M.D.    US Abdomen Complete  11/08/2012  *RADIOLOGY REPORT*  Clinical  Data:  Epigastric abdominal pain, history of chronic pancreatitis  COMPLETE ABDOMINAL ULTRASOUND  Comparison:  12/04/2010 CT  Findings:  Gallbladder:  Absent  Common bile duct:  Normal diameter of 4 mm.  Liver:  No focal lesion identified.  Within normal limits in parenchymal echogenicity.  IVC:  Appears normal.  Pancreas:  Unilocular fluid collection along the head of the pancreas with mild rim thickening and anechoic internal fluid, measures 5.1 cm.   Spleen:  Measures 5 cm oblique.  No focal abnormality.  Right Kidney:  Measures 10.1 cm.  No hydronephrosis or focal abnormality peri  Left Kidney:  Measures 9.2 cm.  No hydronephrosis or focal abnormality.  Abdominal aorta:  No aneurysm identified.  IMPRESSION: 5.1 cm fluid collection along the head of the pancreas. Consider contrast enhanced cross-sectional imaging.   Original Report Authenticated By: Jearld Lesch, M.D.    Ct Abd Wo & W Cm  11/09/2012  *RADIOLOGY REPORT*  Clinical Data: Evaluate pancreatic pseudocyst.  CT ABDOMEN WITHOUT AND WITH CONTRAST,CT PELVIS WITH CONTRAST  Technique:  Multidetector CT imaging of the abdomen was performed following the standard protocol before and during bolus administration of intravenous contrast.,Technique:  Multidetector CT imaging of the pelvis was performed following the standard proto  Contrast: OMNIPAQUE IOHEXOL 300 MG/ML  SOLN  Comparison: CT scan 12/04/2010.  Findings: There is a large cyst in the pancreatic head which measures as simple fluid.  Appears to extend into the lesser sac and has mass effect on the antral pyloric region of the stomach. It measures 5.9 x 4.0 cm and is likely causing mass effect on the main pancreatic duct which is dilated in the pancreatic body and tail and appears be mild atrophy of the pancreas.  Drainage should be considered.  No worrisome enhancing elements to suggest this is a mass.  Other of the mass effect on the duodenum and stomach the peripancreatic tissues are unremarkable.  There are mildly enlarged gastrohepatic, periportal and celiac axis lymph nodes which are likely inflammatory.  The liver is unremarkable.  No focal hepatic lesions or intrahepatic biliary dilatation.  The gallbladder is surgically absent.  The spleen is normal in size.  No focal lesions.  The adrenal glands and kidneys are normal and stable.  The common bile duct is normal in caliber.  The stomach, duodenum, small bowel and colon unremarkable.   No mesenteric or retroperitoneal adenopathy.  The aorta is normal in caliber.  The major branch vessels are normal.  The uterus and ovaries are normal.  No pelvic mass, adenopathy or free pelvic fluid collections.  The bladder is normal.  No inguinal mass or hernia.  The bony structures are unremarkable.  IMPRESSION:  1.  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  The pseudocyst is likely compressing the main pancreatic duct. 2.  Borderline enlarged upper abdominal lymph nodes likely inflammatory/hyperplastic. 3.  Status post cholecystectomy with normal common bile duct caliber.   Original Report Authenticated By: Rudie Meyer, M.D.     Anti-infectives: Anti-infectives   None      Assessment/Plan: Peripancreatic fluid collection (pancreatic pseudocyst) Ct reviewed Needs GI consult and their input ?cyst/fluid collection compressing panc duct Ok with clears.  Will follow  Melinda [name]. Andrey Campanile, MD, FACS General, Bariatric, & Minimally Invasive Surgery [facility], [place]   LOS: 2 days    Melinda [name] 11/10/2012

## 2012-11-10 NOTE — Consult Note (Signed)
Referring Provider: Dr. Mahala Menghini Primary Care Physician:  Malvin Johns, PT Primary Gastroenterologist:  Gentry Fitz  Reason for Consultation:  Abdominal pain; Pancreatic Pseudocyst  HPI: Melinda [name] is a 52 y.o. female with history of pancreatitis in 2012 who presents with 6 months of recurrent epigastric pain especially with meals, decreased appetite, recurrent N/V. Her symptoms have worsened recently and on presentation CT showed a large pancreatic pseudocyst causing a mass-effect on the antrum and pyloric channel of the stomach causing a functional gastric outlet obstruction. Just started on clears and tolerating that so far. Reports alcohol abuse every weekend until reportedly stopping in 2012 after her pancreatitis episode. Denies melena, hematochezia, or hematemesis.  Past Medical History  Diagnosis Date  . Pancreatitis   . Asthma     Past Surgical History  Procedure Laterality Date  . Cholecystectomy      Prior to Admission medications   Medication Sig Start Date End Date Taking? Authorizing Provider  Fluticasone-Salmeterol (ADVAIR) 500-50 MCG/DOSE AEPB Inhale 1 puff into the lungs every 12 (twelve) hours.   Yes Historical Provider, MD    Scheduled Meds: . heparin  5,000 Units Subcutaneous Q8H  . mometasone-formoterol  2 puff Inhalation BID   Continuous Infusions: . sodium chloride 125 mL/hr at 11/10/12 0833   PRN Meds:.HYDROmorphone (DILAUDID) injection, ondansetron (ZOFRAN) IV  Allergies as of 11/08/2012 - Review Complete 11/08/2012  Allergen Reaction Noted  . Codeine Itching and Nausea Only 11/08/2012    History reviewed. No pertinent family history.  History   Social History  . Marital Status: Married    Spouse Name: N/A    Number of Children: N/A  . Years of Education: N/A   Occupational History  . Not on file.   Social History Main Topics  . Smoking status: Current Some Day Smoker  . Smokeless tobacco: Not on file  . Alcohol Use: Yes  . Drug Use: No   . Sexually Active: Not on file   Other Topics Concern  . Not on file   Social History Narrative  . No narrative on file    Review of Systems: All negative except as stated above in HPI.  Physical Exam: Vital signs: Filed Vitals:   11/10/12 0644  BP: 153/62  Pulse: 60  Temp: 97.6 F (36.4 C)  Resp: 18   Last BM Date: 11/08/12 General:   Alert,  Well-developed, well-nourished, pleasant and cooperative in NAD HEENT: anicteric Neck: supple, nontender Lungs:  Clear throughout to auscultation.   No wheezes, crackles, or rhonchi. No acute distress. Heart:  Regular rate and rhythm; no murmurs, clicks, rubs,  or gallops. Abdomen: epigastric tenderness with guarding, soft, mild distention, positive bowel sounds  Rectal:  Deferred  GI:  Lab Results:  Recent Labs  11/08/12 2254 11/09/12 0625 11/10/12 0618  WBC 8.5 6.3 7.0  HGB 13.5 12.4 12.3  HCT 38.8 37.4 36.0  PLT 301 252 249   BMET  Recent Labs  11/08/12 1859 11/08/12 2254 11/09/12 0625 11/10/12 0618  NA 140  --  141 134*  K 4.1  --  3.6 3.4*  CL 104  --  107 100  CO2 24  --  25 23  GLUCOSE 128*  --  100* 79  BUN 16  --  17 8  CREATININE 0.70 0.67 0.79 0.65  CALCIUM 9.4  --  8.7 9.0   LFT  Recent Labs  11/09/12 1517 11/10/12 0618  PROT 6.6 6.3  ALBUMIN 3.6 3.4*  AST 20 16  ALT 19 17  ALKPHOS 69 64  BILITOT 0.3 0.3  BILIDIR <0.1  --   IBILI NOT CALCULATED  --    PT/INR No results found for this basename: LABPROT, INR,  in the last 72 hours   Studies/Results: Ct Pelvis W Contrast  11/09/2012  *RADIOLOGY REPORT*  Clinical Data: Evaluate pancreatic pseudocyst.  CT ABDOMEN WITHOUT AND WITH CONTRAST,CT PELVIS WITH CONTRAST  Technique:  Multidetector CT imaging of the abdomen was performed following the standard protocol before and during bolus administration of intravenous contrast.,Technique:  Multidetector CT imaging of the pelvis was performed following the standard proto  Contrast:  OMNIPAQUE IOHEXOL 300 MG/ML  SOLN  Comparison: CT scan 12/04/2010.  Findings: There is a large cyst in the pancreatic head which measures as simple fluid.  Appears to extend into the lesser sac and has mass effect on the antral pyloric region of the stomach. It measures 5.9 x 4.0 cm and is likely causing mass effect on the main pancreatic duct which is dilated in the pancreatic body and tail and appears be mild atrophy of the pancreas.  Drainage should be considered.  No worrisome enhancing elements to suggest this is a mass.  Other of the mass effect on the duodenum and stomach the peripancreatic tissues are unremarkable.  There are mildly enlarged gastrohepatic, periportal and celiac axis lymph nodes which are likely inflammatory.  The liver is unremarkable.  No focal hepatic lesions or intrahepatic biliary dilatation.  The gallbladder is surgically absent.  The spleen is normal in size.  No focal lesions.  The adrenal glands and kidneys are normal and stable.  The common bile duct is normal in caliber.  The stomach, duodenum, small bowel and colon unremarkable.  No mesenteric or retroperitoneal adenopathy.  The aorta is normal in caliber.  The major branch vessels are normal.  The uterus and ovaries are normal.  No pelvic mass, adenopathy or free pelvic fluid collections.  The bladder is normal.  No inguinal mass or hernia.  The bony structures are unremarkable.  IMPRESSION:  1.  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  The pseudocyst is likely compressing the main pancreatic duct. 2.  Borderline enlarged upper abdominal lymph nodes likely inflammatory/hyperplastic. 3.  Status post cholecystectomy with normal common bile duct caliber.   Original Report Authenticated By: Rudie Meyer, M.D.    US Abdomen Complete  11/08/2012  *RADIOLOGY REPORT*  Clinical Data:  Epigastric abdominal pain,  history of chronic pancreatitis  COMPLETE ABDOMINAL ULTRASOUND  Comparison:  12/04/2010 CT  Findings:  Gallbladder:  Absent  Common bile duct:  Normal diameter of 4 mm.  Liver:  No focal lesion identified.  Within normal limits in parenchymal echogenicity.  IVC:  Appears normal.  Pancreas:  Unilocular fluid collection along the head of the pancreas with mild rim thickening and anechoic internal fluid, measures 5.1 cm.  Spleen:  Measures 5 cm oblique.  No focal abnormality.  Right Kidney:  Measures 10.1 cm.  No hydronephrosis or focal abnormality peri  Left Kidney:  Measures 9.2 cm.  No hydronephrosis or focal abnormality.  Abdominal aorta:  No aneurysm identified.  IMPRESSION: 5.1 cm fluid collection along the head of the pancreas. Consider contrast enhanced cross-sectional imaging.   Original Report Authenticated By: Jearld Lesch, M.D.    Ct Abd Wo & W Cm  11/09/2012  *RADIOLOGY REPORT*  Clinical Data: Evaluate pancreatic pseudocyst.  CT ABDOMEN WITHOUT AND WITH CONTRAST,CT PELVIS WITH CONTRAST  Technique:  Multidetector CT imaging of the abdomen was performed following the standard protocol before and during bolus administration of intravenous contrast.,Technique:  Multidetector CT imaging of the pelvis was performed following the standard proto  Contrast: OMNIPAQUE IOHEXOL 300 MG/ML  SOLN  Comparison: CT scan 12/04/2010.  Findings: There is a large cyst in the pancreatic head which measures as simple fluid.  Appears to extend into the lesser sac and has mass effect on the antral pyloric region of the stomach. It measures 5.9 x 4.0 cm and is likely causing mass effect on the main pancreatic duct which is dilated in the pancreatic body and tail and appears be mild atrophy of the pancreas.  Drainage should be considered.  No worrisome enhancing elements to suggest this is a mass.  Other of the mass effect on the duodenum and stomach the peripancreatic tissues are unremarkable.  There are mildly enlarged  gastrohepatic, periportal and celiac axis lymph nodes which are likely inflammatory.  The liver is unremarkable.  No focal hepatic lesions or intrahepatic biliary dilatation.  The gallbladder is surgically absent.  The spleen is normal in size.  No focal lesions.  The adrenal glands and kidneys are normal and stable.  The common bile duct is normal in caliber.  The stomach, duodenum, small bowel and colon unremarkable.  No mesenteric or retroperitoneal adenopathy.  The aorta is normal in caliber.  The major branch vessels are normal.  The uterus and ovaries are normal.  No pelvic mass, adenopathy or free pelvic fluid collections.  The bladder is normal.  No inguinal mass or hernia.  The bony structures are unremarkable.  IMPRESSION:  1.  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  The pseudocyst is likely compressing the main pancreatic duct. 2.  Borderline enlarged upper abdominal lymph nodes likely inflammatory/hyperplastic. 3.  Status post cholecystectomy with normal common bile duct caliber.   Original Report Authenticated By: Rudie Meyer, M.D.     Impression/Plan: 52 yo woman with abdominal pain and recurrent N/V likely due to large pancreatic pseudocyst causing a mass-effect on the distal stomach. No evidence of biliary obstruction from this pseudocyst but CT evidence of dilated pancreatic ducts. Needs either percutaneous drainage or endoscopic drainage at a tertiary care center. Keep on clears and see how she tolerates and if tolerates can consider advancing to soft diet tomorrow. Would NOT recommend surgery as next step if pseudocyst can be drained by IR or endoscopically. D/W Dr. Mahala Menghini.    LOS: 2 days   Sukhman Kocher C.  11/10/2012, 11:32 AM

## 2012-11-10 NOTE — Progress Notes (Signed)
PROGRESS NOTE  Melinda [name] ZOX:[id number] DOB: 10/28/1960 DOA: 11/08/2012 PCP: Malvin Johns, PT  Brief narrative: 52 year old female admitted with pancreatitis on 11/09/2012.  [place] perfomred revealed a pancreatic pseudocyst and GI and Gen surg were consulted to assit with management  Past medical history-As per Problem list Chart reviewed as below-  Admission 3.17.12 for biliary pancreatitis s/p Lap chole 3.22.12  Consultants:  None currently  Procedures:  Abdominal ultrasound to 11/08/12 = 5.1 cm fluid collection along her pancreas consider contrast enhanced cross-sectional  Ct abdomen 2.21-1. 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 The pseudocyst is likely compressing the main pancreatic duct. 2. Borderline enlarged upper abdominal lymph nodes likely inflammatory/hyperplastic. 3. Status post cholecystectomy with normal common bile duct caliber.   Antibiotics:  None currently   Subjective  Doing well on clears.  Feels a little lightheaded but otherwise fine. NO cp, blurred vision    Objective    Interim History: nad  Telemetry: nad  Objective: Filed Vitals:   11/09/12 2217 11/09/12 2225 11/10/12 0644 11/10/12 0819  BP: 150/64  153/62   Pulse: 72  60   Temp: 97.8 F (36.6 C)  97.6 F (36.4 C)   TempSrc: Oral  Oral   Resp: 18  18   Height:      Weight:      SpO2: 97% 97% 98% 98%   No intake or output data in the 24 hours ending 11/10/12 1213  Exam:  General: alert pleasant oriented CF Cardiovascular: s1 s 2 no m/r/g Respiratory: clear Abdomen: soft, BS now normal.  Epigastric area is alittle tender.  No significant distension   Data Reviewed: Basic Metabolic Panel:  Recent Labs Lab 11/08/12 1859 11/08/12 2254 11/09/12 0625 11/10/12 0618  NA 140  --  141 134*  K 4.1  --  3.6 3.4*  CL 104  --  107 100  CO2 24  --  25 23   GLUCOSE 128*  --  100* 79  BUN 16  --  17 8  CREATININE 0.70 0.67 0.79 0.65  CALCIUM 9.4  --  8.7 9.0   Liver Function Tests:  Recent Labs Lab 11/08/12 2254 11/09/12 1517 11/10/12 0618  AST 24 20 16   ALT 15 19 17   ALKPHOS 66 69 64  BILITOT 0.1* 0.3 0.3  PROT 7.0 6.6 6.3  ALBUMIN 3.6 3.6 3.4*    Recent Labs Lab 11/08/12 1859 11/09/12 1517  LIPASE 192* 88*   No results found for this basename: AMMONIA,  in the last 168 hours CBC:  Recent Labs Lab 11/08/12 1859 11/08/12 2147 11/08/12 2254 11/09/12 0625 11/10/12 0618  WBC 10.2 10.0 8.5 6.3 7.0  NEUTROABS  --  6.3  --   --   --   HGB 14.8 14.0 13.5 12.4 12.3  HCT 42.4 39.7 38.8 37.4 36.0  MCV 91.4 91.5 91.5 92.8 91.6  PLT 351 324 301 252 249   Cardiac Enzymes: No results found for this basename: CKTOTAL, CKMB, CKMBINDEX, TROPONINI,  in the last 168 hours BNP: No components found with this basename: POCBNP,  CBG:  Recent Labs Lab 11/09/12 0803 11/10/12 0638  GLUCAP 88 75    No results found for this or any previous visit (from the past 240 hour(s)).   Studies:              All Imaging reviewed and is as per above  notation   Scheduled Meds: . heparin  5,000 Units Subcutaneous Q8H  . mometasone-formoterol  2 puff Inhalation BID   Continuous Infusions: . sodium chloride 125 mL/hr at 11/10/12 1914     Assessment/Plan: 1. Potential pancreatitis-lipase down from admission to 88-Rpt Cmet and Lipase am.  Tolerating clears.  Graduate to full/soft diet maybe tomorrow am 2. ?Pseudocyst of pancreas-GI recommend sIR vs Tertiary specialty referral-IR recommends against Perc drainage so will need to refer to [facility] as an out-patient for management options 3. H/o Asthma-stable currently 4. Moderately elevated blood pressure-monitor.  Implement meds if prn  Code Status: Full Family Communication: Discussed with mothe rnad father at bedside Disposition Plan: inpatient   Pleas Koch, MD  [facility] Pager [phone] 11/10/2012, 12:13 PM    LOS: 2 days

## 2012-11-11 LAB — GLUCOSE, CAPILLARY: Glucose-Capillary: 84 mg/dL (ref 70–99)

## 2012-11-11 LAB — COMPREHENSIVE METABOLIC PANEL
Alkaline Phosphatase: 66 U/L (ref 39–117)
BUN: 8 mg/dL (ref 6–23)
GFR calc Af Amer: >90 mL/min (ref >90)
Glucose, Bld: 100 mg/dL — ABNORMAL HIGH (ref 70–99)
Potassium: 3.3 mEq/L — ABNORMAL LOW (ref 3.5–5.1)
Total Bilirubin: 0.2 mg/dL — ABNORMAL LOW (ref 0.3–1.2)
Total Protein: 6.7 g/dL (ref 6.0–8.3)

## 2012-11-11 NOTE — Progress Notes (Signed)
Pancreatic pseudocyst/cyst  Assessment: Pancreatic pseudocyst/cyst Pancreatitis with pseudocyst, clinically improving  Plan: No surgical indication at this time. Best choice is endoscopic drainage. Would NOT recommend percutaneous drainage   Subjective: Feels OK, tolerated some soft solid diet without increased pain  Objective: Vital signs in last 24 hours: Temp:  [97.1 F (36.2 C)] 97.1 F (36.2 C) (02/22 1404) Pulse Rate:  [60-63] 63 (02/23 0910) Resp:  [18] 18 (02/23 0910) BP: (144-153)/(63-82) 144/63 mmHg (02/23 0537) SpO2:  [98 %-100 %] 98 % (02/23 0910) Last BM Date: 11/08/12  Intake/Output from previous day: 02/22 0701 - 02/23 0700 In: 1240 [P.O.:240; I.V.:1000] Out: -  Intake/Output this shift:    General appearance: alert, cooperative and no distress GI: soft, non-tender; bowel sounds normal; no masses,  no organomegaly  Lab Results:  Results for orders placed during the hospital encounter of 11/08/12 (from the past 24 hour(s))  LIPASE, BLOOD     Status: Abnormal   Collection Time    11/10/12 12:20 PM      Result Value Range   Lipase 119 (*) 11 - 59 U/L  COMPREHENSIVE METABOLIC PANEL     Status: Abnormal   Collection Time    11/11/12  6:45 AM      Result Value Range   Sodium 138  135 - 145 mEq/L   Potassium 3.3 (*) 3.5 - 5.1 mEq/L   Chloride 102  96 - 112 mEq/L   CO2 27  19 - 32 mEq/L   Glucose, Bld 100 (*) 70 - 99 mg/dL   BUN 8  6 - 23 mg/dL   Creatinine, Ser 4.01  0.50 - 1.10 mg/dL   Calcium 9.2  8.4 - 02.7 mg/dL   Total Protein 6.7  6.0 - 8.3 g/dL   Albumin 3.5  3.5 - 5.2 g/dL   AST 15  0 - 37 U/L   ALT 16  0 - 35 U/L   Alkaline Phosphatase 66  39 - 117 U/L   Total Bilirubin 0.2 (*) 0.3 - 1.2 mg/dL   GFR calc non Af Amer >90  >90 mL/min   GFR calc Af Amer >90  >90 mL/min  GLUCOSE, CAPILLARY     Status: None   Collection Time    11/11/12  7:00 AM      Result Value Range   Glucose-Capillary 84  70 - 99 mg/dL      Studies/Results Radiology     MEDS, Scheduled . heparin  5,000 Units Subcutaneous Q8H  . mometasone-formoterol  2 puff Inhalation BID       LOS: 3 days    Currie Paris, MD, [facility], [place] [phone]   11/11/2012 10:57 AM

## 2012-11-11 NOTE — Progress Notes (Signed)
PROGRESS NOTE  Melinda [name] RUE:[id number] DOB: May 29, 1961 DOA: 11/08/2012 PCP: Malvin Johns, PT  Brief narrative: 52 year old female admitted with pancreatitis on 11/09/2012.  [place] perfomred revealed a pancreatic pseudocyst and GI and Gen surg were consulted to assit with management  Past medical history-As per Problem list Chart reviewed as below-  Admission 3.17.12 for biliary pancreatitis s/p Lap chole 3.22.12  Consultants:  None currently  Procedures:  Abdominal ultrasound to 11/08/12 = 5.1 cm fluid collection along her pancreas consider contrast enhanced cross-sectional  Ct abdomen 2.21-1. 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 The pseudocyst is likely compressing the main pancreatic duct. 2. Borderline enlarged upper abdominal lymph nodes likely inflammatory/hyperplastic. 3. Status post cholecystectomy with normal common bile duct caliber.   Antibiotics:  None currently   Subjective  Doing well on clears.  Feeling a little better.  Tried to eat a little earlier  Had some n this am No vomit   Objective    Interim History: nad  Telemetry: nad  Objective: Filed Vitals:   11/10/12 2200 11/11/12 0537 11/11/12 0910 11/11/12 1343  BP: 153/66 144/63  150/76  Pulse: 62 61 63 66  Temp:    97.9 F (36.6 C)  TempSrc:    Oral  Resp: 18 18 18 18   Height:      Weight:      SpO2: 99% 98% 98% 97%    Intake/Output Summary (Last 24 hours) at 11/11/12 1456 Last data filed at 11/11/12 1100  Gross per 24 hour  Intake    720 ml  Output      2 ml  Net    718 ml    Exam:  General: alert pleasant oriented CF Cardiovascular: s1 s 2 no m/r/g Respiratory: clear Abdomen: soft, BS now normal.  Epigastric area is alittle tender.  No significant distension   Data Reviewed: Basic Metabolic Panel:  Recent Labs Lab 11/08/12 1859 11/08/12 2254  11/09/12 0625 11/10/12 0618 11/11/12 0645  NA 140  --  141 134* 138  K 4.1  --  3.6 3.4* 3.3*  CL 104  --  107 100 102  CO2 24  --  25 23 27   GLUCOSE 128*  --  100* 79 100*  BUN 16  --  17 8 8   CREATININE 0.70 0.67 0.79 0.65 0.68  CALCIUM 9.4  --  8.7 9.0 9.2   Liver Function Tests:  Recent Labs Lab 11/08/12 2254 11/09/12 1517 11/10/12 0618 11/11/12 0645  AST 24 20 16 15   ALT 15 19 17 16   ALKPHOS 66 69 64 66  BILITOT 0.1* 0.3 0.3 0.2*  PROT 7.0 6.6 6.3 6.7  ALBUMIN 3.6 3.6 3.4* 3.5    Recent Labs Lab 11/08/12 1859 11/09/12 1517 11/10/12 1220  LIPASE 192* 88* 119*   No results found for this basename: AMMONIA,  in the last 168 hours CBC:  Recent Labs Lab 11/08/12 1859 11/08/12 2147 11/08/12 2254 11/09/12 0625 11/10/12 0618  WBC 10.2 10.0 8.5 6.3 7.0  NEUTROABS  --  6.3  --   --   --   HGB 14.8 14.0 13.5 12.4 12.3  HCT 42.4 39.7 38.8 37.4 36.0  MCV 91.4 91.5 91.5 92.8 91.6  PLT 351 324 301 252 249   Cardiac Enzymes: No results found for this basename: CKTOTAL, CKMB, CKMBINDEX, TROPONINI,  in the last 168 hours BNP: No components found with  this basename: POCBNP,  CBG:  Recent Labs Lab 11/09/12 0803 11/10/12 0638 11/11/12 0700  GLUCAP 88 75 84    No results found for this or any previous visit (from the past 240 hour(s)).   Studies:              All Imaging reviewed and is as per above notation   Scheduled Meds: . heparin  5,000 Units Subcutaneous Q8H  . mometasone-formoterol  2 puff Inhalation BID   Continuous Infusions:     Assessment/Plan: 1. Potential pancreatitis-lipase down from admission to 88-Rpt Cmet and Lipase am.  Tolerating clears.  Graduate to full/soft diet-iof toelrates well, d/c to follow up as out-patient at tertiary care center [[facility]] for pseudocyst drainage c GI]  If feels worse tomorrow would opt for transfer to [facility] 2. ?Pseudocyst of pancreas-GI recommend sIR vs Tertiary specialty referral-IR recommends against Perc  drainage so will need to refer to [facility] as an out-patient for management options 3. H/o Asthma-stable currently 4. Moderately elevated blood pressure-monitor.  Implement meds if prn  Code Status: Full Family Communication: Discussed with s.o at bedside Disposition Plan: inpatient   Pleas Koch, MD  [facility] Pager [phone] 11/11/2012, 2:56 PM    LOS: 3 days

## 2012-11-11 NOTE — Progress Notes (Addendum)
Patient ID: Melinda [name], female   DOB: [birth year], 52 y.o.   MRN: [id number] [facility] Gastroenterology Progress Note  Melinda [name] 52 y.o. [birth year]   Subjective Decreased abdominal pain. Tolerating soft diet. No N/V. Spouse in room. Had just come from outside and smells like cigarettes during my evaluation.   Objective: Vital signs in last 24 hours: Filed Vitals:   11/11/12 1343  BP: 150/76  Pulse: 66  Temp: 97.9 F (36.6 C)  Resp: 18    Physical Exam: Gen: alert, no acute distress Abd: epigastric tenderness with minimal guarding, soft, nondistended, +BS  Lab Results:  Recent Labs  11/10/12 0618 11/11/12 0645  NA 134* 138  K 3.4* 3.3*  CL 100 102  CO2 23 27  GLUCOSE 79 100*  BUN 8 8  CREATININE 0.65 0.68  CALCIUM 9.0 9.2    Recent Labs  11/10/12 0618 11/11/12 0645  AST 16 15  ALT 17 16  ALKPHOS 64 66  BILITOT 0.3 0.2*  PROT 6.3 6.7  ALBUMIN 3.4* 3.5    Recent Labs  11/08/12 2147  11/09/12 0625 11/10/12 0618  WBC 10.0  < > 6.3 7.0  NEUTROABS 6.3  --   --   --   HGB 14.0  < > 12.4 12.3  HCT 39.7  < > 37.4 36.0  MCV 91.5  < > 92.8 91.6  PLT 324  < > 252 249  < > = values in this interval not displayed. No results found for this basename: LABPROT, INR,  in the last 72 hours    Assessment/Plan: 51yo with pancreatitis and large pancreatic pseudocyst causing mass-effect on the distal stomach. Agree that endoscopic drainage is preferred. D/W Dr. Dulce Sellar yesterday who will review CT tomorrow and decide whether he can drain the pseudocyst and if not will need referral to [facility] as outpt to drain it. Will advance to low fat diet.   Sudie Bandel C. 11/11/2012, 2:54 PM

## 2012-11-12 MED ORDER — OXYCODONE-ACETAMINOPHEN 7.5-325 MG PO TABS: 1.0000 | ORAL_TABLET | ORAL | Status: DC | PRN

## 2012-11-12 MED ORDER — ONDANSETRON 4 MG PO TBDP: 4.0000 mg | ORAL_TABLET | Freq: Three times a day (TID) | ORAL | Status: DC | PRN

## 2012-11-12 NOTE — Discharge Summary (Signed)
Physician Discharge Summary  [facility] RUE:[id number] DOB: 1961-01-06 DOA: 11/08/2012  PCP: Malvin Johns, PT  Admit date: 11/08/2012 Discharge date: 11/12/2012  Time spent: 25 minutes  Recommendations for Outpatient Follow-up:  1. Needs low fat diet on discharge home 2. Follow-up being coordinated 11/14/12 for EUS and potential EGD guided aspiration of Pseudocyst  Discharge Diagnoses:  Principal Problem:   Pancreatic pseudocyst/cyst Active Problems:   Pancreatitis   Elevated blood pressure   Discharge Condition: Fair  Diet recommendation: Low fat  Filed Weights   11/09/12 0009  Weight: 79.6 kg (175 lb 7.8 oz)    History of present illness:  52 year old female admitted with pancreatitis on 11/09/2012. [place] perfomred revealed a pancreatic pseudocyst and GI and Gen surg were consulted to assit with management   Hospital Course:   1. Potential pancreatitis-lipase down from admission to 88-Rpt Cmet and Lipase am. Tolerating clears. Tolerating full diet.  Advised by GI low fat diet well, d/c to follow up as out-patient c Dr. Dulce Sellar for EUS and drainage of the same-d/c hoem on zofran ODT 4 mg for nasuea as well as percocet 7.5-325 for pain 2. ?Pseudocyst of pancreas-GI recommend sIR vs Tertiary specialty referral-IR recommends against Perc drainage.  See above 3. H/o Asthma-stable currently 4. Moderately elevated blood pressure-monitor. Outpatient follow-up   Consultants:  None currently  Procedures:  Abdominal ultrasound to 11/08/12 = 5.1 cm fluid collection along her pancreas consider contrast enhanced cross-sectional  Ct abdomen 2.21-1. 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 The pseudocyst is likely compressing the main pancreatic duct. 2. Borderline enlarged upper abdominal lymph nodes likely inflammatory/hyperplastic. 3. Status post cholecystectomy with  normal common bile duct caliber.  Antibiotics:  None currently   Discharge Exam: Filed Vitals:   11/11/12 2014 11/11/12 2223 11/12/12 0609 11/12/12 0735  BP:  154/79 165/86   Pulse:  60 63   Temp:  97.7 F (36.5 C) 97.9 F (36.6 C)   TempSrc:  Oral Oral   Resp:  17 18   Height:      Weight:      SpO2: 94% 95% 96% 97%   Doing fair Some nasuea last pm NO CP or SOB.  Had  Episode of emesis last pm as well   General: nad Cardiovascular: s1 s2 no m/r/g Respiratory:  Clear Abd slightly tender, ND  Discharge Instructions  Discharge Orders   Future Orders Complete By Expires     Call MD for:  difficulty breathing, headache or visual disturbances  As directed     Call MD for:  hives  As directed     Call MD for:  persistant nausea and vomiting  As directed     Call MD for:  severe uncontrolled pain  As directed     Diet general  As directed     Scheduling Instructions:      Low fat diet needed    Increase activity slowly  As directed         Medication List    TAKE these medications       Fluticasone-Salmeterol 500-50 MCG/DOSE Aepb  Commonly known as:  ADVAIR  Inhale 1 puff into the lungs every 12 (twelve) hours.     ondansetron 4 MG disintegrating tablet  Commonly known as:  ZOFRAN ODT  Take 1 tablet (4 mg total) by mouth every 8 (eight) hours as needed for nausea.  oxyCODONE-acetaminophen 7.5-325 MG per tablet  Commonly known as:  PERCOCET  Take 1 tablet by mouth every 4 (four) hours as needed for pain.          The results of significant diagnostics from this hospitalization (including imaging, microbiology, ancillary and laboratory) are listed below for reference.    Significant Diagnostic Studies: Ct Pelvis W Contrast  11/09/2012  *RADIOLOGY REPORT*  Clinical Data: Evaluate pancreatic pseudocyst.  CT ABDOMEN WITHOUT AND WITH CONTRAST,CT PELVIS WITH CONTRAST  Technique:  Multidetector CT imaging of the abdomen was performed following the standard  protocol before and during bolus administration of intravenous contrast.,Technique:  Multidetector CT imaging of the pelvis was performed following the standard proto  Contrast: OMNIPAQUE IOHEXOL 300 MG/ML  SOLN  Comparison: CT scan 12/04/2010.  Findings: There is a large cyst in the pancreatic head which measures as simple fluid.  Appears to extend into the lesser sac and has mass effect on the antral pyloric region of the stomach. It measures 5.9 x 4.0 cm and is likely causing mass effect on the main pancreatic duct which is dilated in the pancreatic body and tail and appears be mild atrophy of the pancreas.  Drainage should be considered.  No worrisome enhancing elements to suggest this is a mass.  Other of the mass effect on the duodenum and stomach the peripancreatic tissues are unremarkable.  There are mildly enlarged gastrohepatic, periportal and celiac axis lymph nodes which are likely inflammatory.  The liver is unremarkable.  No focal hepatic lesions or intrahepatic biliary dilatation.  The gallbladder is surgically absent.  The spleen is normal in size.  No focal lesions.  The adrenal glands and kidneys are normal and stable.  The common bile duct is normal in caliber.  The stomach, duodenum, small bowel and colon unremarkable.  No mesenteric or retroperitoneal adenopathy.  The aorta is normal in caliber.  The major branch vessels are normal.  The uterus and ovaries are normal.  No pelvic mass, adenopathy or free pelvic fluid collections.  The bladder is normal.  No inguinal mass or hernia.  The bony structures are unremarkable.  IMPRESSION:  1.  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  The pseudocyst is likely compressing the main pancreatic duct. 2.  Borderline enlarged upper abdominal lymph nodes likely inflammatory/hyperplastic. 3.  Status post cholecystectomy with normal  common bile duct caliber.   Original Report Authenticated By: Rudie Meyer, M.D.    US Abdomen Complete  11/08/2012  *RADIOLOGY REPORT*  Clinical Data:  Epigastric abdominal pain, history of chronic pancreatitis  COMPLETE ABDOMINAL ULTRASOUND  Comparison:  12/04/2010 CT  Findings:  Gallbladder:  Absent  Common bile duct:  Normal diameter of 4 mm.  Liver:  No focal lesion identified.  Within normal limits in parenchymal echogenicity.  IVC:  Appears normal.  Pancreas:  Unilocular fluid collection along the head of the pancreas with mild rim thickening and anechoic internal fluid, measures 5.1 cm.  Spleen:  Measures 5 cm oblique.  No focal abnormality.  Right Kidney:  Measures 10.1 cm.  No hydronephrosis or focal abnormality peri  Left Kidney:  Measures 9.2 cm.  No hydronephrosis or focal abnormality.  Abdominal aorta:  No aneurysm identified.  IMPRESSION: 5.1 cm fluid collection along the head of the pancreas. Consider contrast enhanced cross-sectional imaging.   Original Report Authenticated By: Jearld Lesch, M.D.  Ct Abd Wo & W Cm  11/09/2012  *RADIOLOGY REPORT*  Clinical Data: Evaluate pancreatic pseudocyst.  CT ABDOMEN WITHOUT AND WITH CONTRAST,CT PELVIS WITH CONTRAST  Technique:  Multidetector CT imaging of the abdomen was performed following the standard protocol before and during bolus administration of intravenous contrast.,Technique:  Multidetector CT imaging of the pelvis was performed following the standard proto  Contrast: OMNIPAQUE IOHEXOL 300 MG/ML  SOLN  Comparison: CT scan 12/04/2010.  Findings: There is a large cyst in the pancreatic head which measures as simple fluid.  Appears to extend into the lesser sac and has mass effect on the antral pyloric region of the stomach. It measures 5.9 x 4.0 cm and is likely causing mass effect on the main pancreatic duct which is dilated in the pancreatic body and tail and appears be mild atrophy of the pancreas.  Drainage should be considered.   No worrisome enhancing elements to suggest this is a mass.  Other of the mass effect on the duodenum and stomach the peripancreatic tissues are unremarkable.  There are mildly enlarged gastrohepatic, periportal and celiac axis lymph nodes which are likely inflammatory.  The liver is unremarkable.  No focal hepatic lesions or intrahepatic biliary dilatation.  The gallbladder is surgically absent.  The spleen is normal in size.  No focal lesions.  The adrenal glands and kidneys are normal and stable.  The common bile duct is normal in caliber.  The stomach, duodenum, small bowel and colon unremarkable.  No mesenteric or retroperitoneal adenopathy.  The aorta is normal in caliber.  The major branch vessels are normal.  The uterus and ovaries are normal.  No pelvic mass, adenopathy or free pelvic fluid collections.  The bladder is normal.  No inguinal mass or hernia.  The bony structures are unremarkable.  IMPRESSION:  1.  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  The pseudocyst is likely compressing the main pancreatic duct. 2.  Borderline enlarged upper abdominal lymph nodes likely inflammatory/hyperplastic. 3.  Status post cholecystectomy with normal common bile duct caliber.   Original Report Authenticated By: Rudie Meyer, M.D.     Microbiology: No results found for this or any previous visit (from the past 240 hour(s)).   Labs: Basic Metabolic Panel:  Recent Labs Lab 11/08/12 1859 11/08/12 2254 11/09/12 0625 11/10/12 0618 11/11/12 0645  NA 140  --  141 134* 138  K 4.1  --  3.6 3.4* 3.3*  CL 104  --  107 100 102  CO2 24  --  25 23 27   GLUCOSE 128*  --  100* 79 100*  BUN 16  --  17 8 8   CREATININE 0.70 0.67 0.79 0.65 0.68  CALCIUM 9.4  --  8.7 9.0 9.2   Liver Function Tests:  Recent Labs Lab 11/08/12 2254 11/09/12 1517 11/10/12 0618 11/11/12 0645  AST 24 20 16 15    ALT 15 19 17 16   ALKPHOS 66 69 64 66  BILITOT 0.1* 0.3 0.3 0.2*  PROT 7.0 6.6 6.3 6.7  ALBUMIN 3.6 3.6 3.4* 3.5    Recent Labs Lab 11/08/12 1859 11/09/12 1517 11/10/12 1220  LIPASE 192* 88* 119*   No results found for this basename: AMMONIA,  in the last 168 hours CBC:  Recent Labs Lab 11/08/12 1859 11/08/12 2147 11/08/12 2254 11/09/12 0625 11/10/12 0618  WBC 10.2 10.0 8.5 6.3 7.0  NEUTROABS  --  6.3  --   --   --   HGB 14.8 14.0 13.5 12.4 12.3  HCT 42.4 39.7 38.8 37.4 36.0  MCV 91.4 91.5 91.5 92.8 91.6  PLT 351 324 301 252 249   Cardiac Enzymes: No results found for this basename: CKTOTAL, CKMB, CKMBINDEX, TROPONINI,  in the last 168 hours BNP: BNP (last 3 results) No results found for this basename: PROBNP,  in the last 8760 hours CBG:  Recent Labs Lab 11/09/12 0803 11/10/12 0638 11/11/12 0700 11/12/12 0652  GLUCAP 88 75 84 101*       Signed:  Jeanenne Licea, JAI-GURMUKH  [facility] 11/12/2012, 10:12 AM

## 2012-11-12 NOTE — Progress Notes (Signed)
Patient ID: Melinda [name], female   DOB: 07-28-1961, 52 y.o.   MRN: [id number]   D/W Dr. Dulce Sellar who will attempt EUS drainage of pancreatic pseudocyst. D/W Dr. Mahala Menghini and recommended low fat diet and if tolerates can go home and have procedure this Wednesday at [facility] as an outpt. Patient on schedule for EUS this Wednesday at [facility] [address] Time TBD. D/W plan with Ms. Escalante.

## 2012-11-12 NOTE — Progress Notes (Signed)
11/12/2012 1200 No NCM needs identified. Isidoro Donning RN CCM Case Mgmt phone [phone]

## 2012-11-12 NOTE — Clinical Social Work Psychosocial (Signed)
Clinical Social Work Department BRIEF PSYCHOSOCIAL ASSESSMENT 11/12/2012  Patient:  Melinda [name],Melinda [name]     Account Number:  [id number]     Admit date:  11/08/2012  Clinical Social Worker:  Peggyann Shoals  Date/Time:  11/12/2012 01:38 PM  Referred by:  Physician  Date Referred:  11/09/2012 Referred for  Other - See comment   Other Referral:   Financial issues and uninsured.   Interview type:  Patient Other interview type:    PSYCHOSOCIAL DATA Living Status:  HUSBAND Admitted from facility:   Level of care:   Primary support name:  Melinda [name] Primary support relationship to patient:  SPOUSE Degree of support available:   supportive.    CURRENT CONCERNS Current Concerns  Financial Resources  Other - See comment   Other Concerns:   Uninsured    SOCIAL WORK ASSESSMENT / PLAN CSW met with pt to address consult. CSW introduced herself and explained role of social work.    Pt lives with husband. Pt is unemployed and uninsured, however, pt's husband is employed, and they live off his income. Pt has supportive parents as well.    Pt shared that the financial counselor will be mailing paperwork to pt's home, which she is to mail back to the financial counselor.    CSW encourage pt to follow up with DSS to inquire about additional services pt may qualify for.    CSW is signing off as no further needs identified, however is available if further assistance is needed.   Assessment/plan status:  No Further Intervention Required Other assessment/ plan:   Information/referral to community resources:   [facility] DSS.    PATIENT'S/FAMILY'S RESPONSE TO PLAN OF CARE: Pt was very pleasant and understood that she would be recieving paperwork to complete for further assistance.    Dede Query, MSW, Theresia Majors [phone]

## 2012-11-12 NOTE — Progress Notes (Signed)
AVS and discharge instruction given to pt with her prescription script ,. Appointment with Dr Dulce Sellar for EUP schedule at [facility]  outpatient at 10;30 am on Wednesday,26 2014 pt called to confirm appointment. .  Pre-op education initiated with pt pt verbalized good understanding. Awaiting mother to pick up .  Saline locked d/c and intact Condition prior to  D/c home is stable

## 2012-11-13 ENCOUNTER — Encounter (HOSPITAL_COMMUNITY): Payer: Self-pay | Admitting: Pharmacy Technician

## 2012-11-13 ENCOUNTER — Encounter (HOSPITAL_COMMUNITY): Payer: Self-pay | Admitting: *Deleted

## 2012-11-13 NOTE — Pre-Procedure Instructions (Addendum)
Your procedure is scheduled on:Wednesday, November 14, 2012 Report to [facility] Admitting [id number] Call this number if you have problems morning of your procedure:[phone]  Follow all bowel prep instructions per your doctor's orders.  Do not eat or drink anything after midnight the night before your procedure. You may brush your teeth, rinse out your mouth, but no water, no food, no chewing gum, no mints, no candies, no chewing tobacco.     Take these medicines the morning of your procedure with A SIP OF WATER:Use Advair inhaler amd may take Oxycodone if needed   Please make arrangements for a responsible person to drive you home after the procedure. You cannot go home by cab/taxi. We recommend you have someone with you at home the first 24 hours after your procedure. Driver for procedure is spouse Trinidi Toppins [phone] and  parents Clint Bolder [phone]  LEAVE ALL VALUABLES, JEWELRY, BILLFOLD AT HOME.  NO DENTURES, CONTACT LENSES ALLOWED IN THE ENDOSCOPY ROOM.   YOU MAY WEAR DEODORANT, PLEASE REMOVE ALL JEWELRY, WATCHES RINGS, BODY PIERCINGS AND LEAVE AT HOME.   WOMEN: NO MAKE-UP, LOTIONS PERFUMES

## 2012-11-14 ENCOUNTER — Encounter (HOSPITAL_COMMUNITY)
Admission: RE | Disposition: A | Discharge status: Home / Self Care | Payer: Self-pay | Source: Ambulatory Visit | Attending: Gastroenterology

## 2012-11-14 ENCOUNTER — Encounter (HOSPITAL_COMMUNITY): Payer: Self-pay | Admitting: Anesthesiology

## 2012-11-14 ENCOUNTER — Ambulatory Visit (HOSPITAL_COMMUNITY)
Admission: RE | Admit: 2012-11-14 | Discharge: 2012-11-14 | Disposition: A | Discharge status: Home / Self Care | Payer: Self-pay | Source: Ambulatory Visit | Attending: Gastroenterology | Admitting: Gastroenterology

## 2012-11-14 ENCOUNTER — Encounter (HOSPITAL_COMMUNITY): Payer: Self-pay | Admitting: *Deleted

## 2012-11-14 ENCOUNTER — Ambulatory Visit (HOSPITAL_COMMUNITY): Payer: Self-pay | Admitting: Anesthesiology

## 2012-11-14 DIAGNOSIS — K859 Acute pancreatitis without necrosis or infection, unspecified: Secondary | ICD-10-CM | POA: Insufficient documentation

## 2012-11-14 DIAGNOSIS — M549 Dorsalgia, unspecified: Secondary | ICD-10-CM | POA: Insufficient documentation

## 2012-11-14 DIAGNOSIS — R112 Nausea with vomiting, unspecified: Secondary | ICD-10-CM | POA: Insufficient documentation

## 2012-11-14 DIAGNOSIS — R030 Elevated blood-pressure reading, without diagnosis of hypertension: Secondary | ICD-10-CM | POA: Insufficient documentation

## 2012-11-14 DIAGNOSIS — K862 Cyst of pancreas: Secondary | ICD-10-CM | POA: Insufficient documentation

## 2012-11-14 DIAGNOSIS — R1013 Epigastric pain: Secondary | ICD-10-CM | POA: Insufficient documentation

## 2012-11-14 DIAGNOSIS — J45909 Unspecified asthma, uncomplicated: Secondary | ICD-10-CM | POA: Insufficient documentation

## 2012-11-14 DIAGNOSIS — F17200 Nicotine dependence, unspecified, uncomplicated: Secondary | ICD-10-CM | POA: Insufficient documentation

## 2012-11-14 HISTORY — PX: EUS: SHX5427

## 2012-11-14 LAB — PANC CYST FLD ANLYS-PATHFNDR-TG

## 2012-11-14 SURGERY — ULTRASOUND, UPPER GI TRACT, ENDOSCOPIC
Anesthesia: Monitor Anesthesia Care

## 2012-11-14 MED ORDER — MIDAZOLAM HCL 5 MG/5ML IJ SOLN
INTRAMUSCULAR | Status: DC | PRN
  Administered 2012-11-14: 2 mg via INTRAVENOUS

## 2012-11-14 MED ORDER — CIPROFLOXACIN IN D5W 400 MG/200ML IV SOLN
INTRAVENOUS | Status: AC
  Filled 2012-11-14: qty 200

## 2012-11-14 MED ORDER — CIPROFLOXACIN IN D5W 400 MG/200ML IV SOLN
INTRAVENOUS | Status: DC | PRN
  Administered 2012-11-14: 400 mg via INTRAVENOUS

## 2012-11-14 MED ORDER — KETAMINE HCL 50 MG/ML IJ SOLN
INTRAMUSCULAR | Status: DC | PRN
  Administered 2012-11-14 (×2): 10 mg via INTRAMUSCULAR

## 2012-11-14 MED ORDER — CIPROFLOXACIN HCL 500 MG PO TABS: 500.0000 mg | ORAL_TABLET | Freq: Two times a day (BID) | ORAL | Status: DC

## 2012-11-14 MED ORDER — BUTAMBEN-TETRACAINE-BENZOCAINE 2-2-14 % EX AERO
INHALATION_SPRAY | CUTANEOUS | Status: DC | PRN
  Administered 2012-11-14: 2 via TOPICAL

## 2012-11-14 MED ORDER — LIDOCAINE HCL (CARDIAC) 20 MG/ML IV SOLN
INTRAVENOUS | Status: DC | PRN
  Administered 2012-11-14: 50 mg via INTRAVENOUS

## 2012-11-14 MED ORDER — SODIUM CHLORIDE 0.9 % IV SOLN: INTRAVENOUS | Status: DC

## 2012-11-14 MED ORDER — PROPOFOL 10 MG/ML IV EMUL
INTRAVENOUS | Status: DC | PRN
  Administered 2012-11-14: 75 ug/kg/min via INTRAVENOUS

## 2012-11-14 MED ORDER — FENTANYL CITRATE 0.05 MG/ML IJ SOLN
INTRAMUSCULAR | Status: DC | PRN
  Administered 2012-11-14 (×4): 25 ug via INTRAVENOUS

## 2012-11-14 MED ORDER — PROPOFOL 10 MG/ML IV BOLUS
INTRAVENOUS | Status: DC | PRN
  Administered 2012-11-14 (×10): 20 mg via INTRAVENOUS

## 2012-11-14 MED ORDER — LACTATED RINGERS IV SOLN
INTRAVENOUS | Status: DC | PRN
  Administered 2012-11-14: 13:00:00 via INTRAVENOUS

## 2012-11-14 NOTE — Anesthesia Postprocedure Evaluation (Signed)
Anesthesia Post Note  Patient: Melinda [name]  Procedure(s) Performed: Procedure(s) (LRB): FULL UPPER ENDOSCOPIC ULTRASOUND (EUS) RADIAL (N/A)  Anesthesia type: MAC  Patient location: PACU  Post pain: Pain level controlled  Post assessment: Post-op Vital signs reviewed  Last Vitals: BP 132/82  Pulse 90  Temp(Src) 36.7 C (Oral)  Resp 10  Ht 5\' 2"  (1.575 m)  Wt 170 lb (77.111 kg)  BMI 31.09 kg/m2  SpO2 98%  Post vital signs: Reviewed  Level of consciousness: awake  Complications: No apparent anesthesia complications

## 2012-11-14 NOTE — Anesthesia Preprocedure Evaluation (Addendum)
Anesthesia Evaluation  Patient identified by MRN, date of birth, ID band Patient awake    Reviewed: Allergy & Precautions, H&P , NPO status , Patient's Chart, lab work & pertinent test results  Airway Mallampati: II TM Distance: >3 FB Neck ROM: Full    Dental  (+) Dental Advisory Given, Edentulous Upper and Partial Lower   Pulmonary asthma , Current Smoker,  breath sounds clear to auscultation  Pulmonary exam normal       Cardiovascular negative cardio ROS  Rhythm:Regular Rate:Normal     Neuro/Psych negative neurological ROS  negative psych ROS   GI/Hepatic negative GI ROS, Neg liver ROS,   Endo/Other  negative endocrine ROS  Renal/GU negative Renal ROS     Musculoskeletal negative musculoskeletal ROS (+)   Abdominal   Peds  Hematology negative hematology ROS (+)   Anesthesia Other Findings   Reproductive/Obstetrics negative OB ROS                          Anesthesia Physical Anesthesia Plan  ASA: II  Anesthesia Plan: MAC   Post-op Pain Management:    Induction: Intravenous  Airway Management Planned: Simple Face Mask and Nasal Cannula  Additional Equipment:   Intra-op Plan:   Post-operative Plan:   Informed Consent: I have reviewed the patients History and Physical, chart, labs and discussed the procedure including the risks, benefits and alternatives for the proposed anesthesia with the patient or authorized representative who has indicated his/her understanding and acceptance.   Dental advisory given  Plan Discussed with: CRNA  Anesthesia Plan Comments:         Anesthesia Quick Evaluation

## 2012-11-14 NOTE — H&P (View-Only) (Signed)
[facility] History and Physical  Melinda [name] MRN:[id number] DOB: 08/12/1961 DOA: 11/08/2012  Referring physician: ED PCP: ELKINS,JOHN, PT  Specialists: None  Chief Complaint: Abdominal pain  HPI: Melinda [name] is a 51 y.o. female who presents with c/o epigastric abdominal pain radiating to her back.  This episode onset approximently 1 week ago but she has had off and on abdominal pain about 3 days a week for the past 6 months and frequently since her bout of pancreatitis in 2012.  Due to lack of insurance she does not routinely see a physician and has not sought medical treatment until today for her ongoing abdominal pain.  Her abdominal pain is worse with eating better when not eating, tylenol provided only minimal relief.  In the ED she was found to have an elevated lipase in the 190s, hospitalist has been asked to admit for pancreatitis.  Review of Systems: Patient notes associated nausea, 1 episode of vomiting, very rare EtOH use (once every 3 weekends or so due to it making her abdominal pain worse), no history of being on DDI, any DM2 medications, no history of scorpion sting, gall bladder removed after gallstones caused pancreatitis in 2012. 12 systems reviewed and otherwise negative.  Past Medical History  Diagnosis Date  . Pancreatitis   . Asthma    Past Surgical History  Procedure Laterality Date  . Cholecystectomy     Social History:  reports that she has been smoking.  She does not have any smokeless tobacco history on file. She reports that  drinks alcohol. She reports that she does not use illicit drugs.   Allergies  Allergen Reactions  . Codeine Itching and Nausea Only    History reviewed. No pertinent family history. No h/o pancreatitis.  Prior to Admission medications   Medication Sig Start Date End Date Taking? Authorizing Provider  Fluticasone-Salmeterol (ADVAIR) 500-50 MCG/DOSE AEPB Inhale 1 puff into the lungs every 12 (twelve) hours.   Yes Historical  Provider, MD   Physical Exam: Filed Vitals:   11/08/12 2200 11/08/12 2215 11/08/12 2230 11/08/12 2245  BP:  144/87 144/77 145/72  Pulse: 89 83 83 86  Temp:      TempSrc:      Resp: 18 12 13 23  SpO2: 98% 97% 96% 98%     General:  NAD, has obvious abdominal pain  Eyes: PEERLA EOMI  ENT: mucous membranes moist  Neck: supple w/o JVD  Cardiovascular: RRR w/o MRG  Respiratory: CTA B  Abdomen: soft, epigastric tenderness, no rebound, no guarding, nd, bs+  Skin: no rash nor lesion  Musculoskeletal: MAE, full ROM all 4 extremities  Psychiatric: normal tone and affect  Neurologic: AAOx3, grossly non-focal  Labs on Admission:  Basic Metabolic Panel:  Recent Labs Lab 11/08/12 1859  NA 140  K 4.1  CL 104  CO2 24  GLUCOSE 128*  BUN 16  CREATININE 0.70  CALCIUM 9.4   Liver Function Tests: No results found for this basename: AST, ALT, ALKPHOS, BILITOT, PROT, ALBUMIN,  in the last 168 hours  Recent Labs Lab 11/08/12 1859  LIPASE 192*   No results found for this basename: AMMONIA,  in the last 168 hours CBC:  Recent Labs Lab 11/08/12 1859 11/08/12 2147  WBC 10.2 10.0  NEUTROABS  --  6.3  HGB 14.8 14.0  HCT 42.4 39.7  MCV 91.4 91.5  PLT 351 324   Cardiac Enzymes: No results found for this basename: CKTOTAL, CKMB, CKMBINDEX, TROPONINI,  in the last 168   hours  BNP (last 3 results) No results found for this basename: PROBNP,  in the last 8760 hours CBG: No results found for this basename: GLUCAP,  in the last 168 hours  Radiological Exams on Admission: No results found.  EKG: Independently reviewed.  Assessment/Plan Principal Problem:   Pancreatitis Active Problems:   Elevated blood pressure   1. Pancreatitis - abdominal ultrasound ordered and pending, liver enzymes ordered and pending, while this may represent a retained stone, this seems somewhat less likely given the history.  More likely is that the patient has chronic pancreatitis not  entirely resolved since her 2012 episode of pancreatitis.  Will see if there is a pseudocyst on ultrasound.  Also checking lipid panel in AM to make sure we arnt missing hypertriglyceridemia as the cause for her pancreatitis.  No SIRS criteria so no indication for ABx at this point.  Keeping patient NPO except ice chips, IVF at 125 cc/hr, pain control with dilaudid PRN. 2. Elevated blood pressure - monitor for now, possibly due to pain vs undiagnosed HTN. 3. Asthma - continue home advair    Code Status: Full code (must indicate code status--if unknown or must be presumed, indicate so) Family Communication: Spoke with husband at bedside (indicate person spoken with, if applicable, with phone number if by telephone) Disposition Plan: Admit to inpatient (indicate anticipated LOS)  Time spent: 70 min  GARDNER, JARED M. [facility] Pager [phone]  If 7PM-7AM, please contact night-coverage [email or web address] Password [email or web address] 11/08/2012, 10:55 PM       

## 2012-11-14 NOTE — Interval H&P Note (Signed)
History and Physical Interval Note:  11/14/2012 12:53 PM  Melinda [name]  has presented today for surgery, with the diagnosis of pancreatic psuedocyst drainage  The various methods of treatment have been discussed with the patient and family. After consideration of risks, benefits and other options for treatment, the patient has consented to  Procedure(s) with comments: FULL UPPER ENDOSCOPIC ULTRASOUND (EUS) RADIAL (N/A) - extended case to 90 min per Dr Dulce Sellar for definate FNA/JB as a surgical intervention .  The patient's history has been reviewed, patient examined, no change in status, stable for surgery.  I have reviewed the patient's chart and labs.  Questions were answered to the patient's satisfaction.     Abbott Jasinski M  Assessment:  1.  Pancreatitis, presumed biliary etiology. 2.  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  Plan:  1.  Endoscopic ultrasound with possible fine needle aspiration (cyst fluid analysis, and cyst aspiration). 2.  Cipro 400 mg IV intra-procedurally. 3.  Risks (bleeding, infection, bowel perforation that could require surgery, sedation-related changes in cardiopulmonary systems), benefits (identification and possible treatment of source of symptoms, exclusion of certain causes of symptoms), and alternatives (watchful waiting, radiographic imaging studies, empiric medical treatment) of upper endoscopy with ultrasound and possible fine needle aspiration (EUS +/- FNA) were explained to patient/family in detail and patient wishes to proceed.

## 2012-11-14 NOTE — Op Note (Signed)
[facility] [address] [place], [address]   ENDOSCOPIC ULTRASOUND PROCEDURE REPORT  PATIENT: Melinda, [name]  MR#: [id number] BIRTHDATE: June 03, 1961  GENDER: Female ENDOSCOPIST: Willis Modena, MD REFERRED BY:  Charlott Rakes, M.D. PROCEDURE DATE:  11/14/2012 PROCEDURE:   Upper EUS w/FNA ASA CLASS:      Class II INDICATIONS:   1.  abdominal pain, nausea, vomiting, pancreatic cyst, prior pancreatitis. MEDICATIONS: MAC sedation, administered by CRNA, Cipro 400 mg IV, and Cetacaine spray x 2 DESCRIPTION OF PROCEDURE:   After the risks benefits and alternatives of the procedure were  explained, informed consent was obtained. The patient was then placed in the left, lateral, decubitus postion and IV sedation was administered. Throughout the procedure, the patients blood pressure, pulse and oxygen saturations were monitored continuously.  Under direct visualization, the Pentax EUS Linear  endoscope was introduced through the mouth  and advanced to the second portion of the duodenum .  Water was used as necessary to provide an acoustic interface.  Upon completion of the imaging, water was removed and the patient was sent to the recovery room in satisfactory condition.   FINDINGS:      Extensive changes of pancreatitis (I favor acute on chronic, but this can't be definitively ascertained) throughout pancreas, including peripancreatic small benign-appearing adenopathy, extensive main-duct pancreatic ductal dilatation with hyperechoic pancreatic duct walls and visible pancreatic ductal side branches, extensive lobularity.  No discrete mass is seen, but ability of EUS in detecting mass lesion in setting of such extensive pancreatitis is relatively diminished.  Endoscopically, at the region of the pylorus, there is seemingly extrinsic gastric compression of both the antrum and the pylorus channel, though the linear echoendoscope was able to traverse this area  without difficulty into the duodenum.  Ultrasonograophically, the head of pancreas and uncinate pancreas appear a bit compressed and distorted by presence of large well defined cyst with hyperechoic rim; cyst is about 4.5 x 6.0 cm in size, and has lots of indwelling debris.  Bile duct non-dilated and no obvious bile duct stones seen (again, views compromised to due anatomic distortion from the cyst).  Cyst was punctured with 22g needle and aspirated to near-collapse; about 40cc of tan fairly thin non-mucoid fluid was aspirated, some of which was sent to lab for analysis (amylase, CEA).  Post-cholecystectomy.  IMPRESSION:     As above.  Suspect patient has pancreatic pseudocyst in setting of acute on chronic pancreatitis.  RECOMMENDATIONS:     1.  Watch for potential complications of procedure. 2.  Await cyst fluid results. 3.  Await clincal response of gastric outlet symptoms to cyst aspiration. 4.  If gastric outlet symptoms begin anew, and the pancreatic cyst has recurred, could consider tertiary center evaluation for consideration of endoscopic cystgastrostomy. 5.  Strict alcohol abstinence. 6.  Follow-up in [facility] in 6-8 weeks. eSigned:  Willis Modena, MD 11/14/2012 1:52 PM CC:

## 2012-11-14 NOTE — Transfer of Care (Signed)
Immediate Anesthesia Transfer of Care Note  Patient: Melinda [name]  Procedure(s) Performed: Procedure(s) (LRB): FULL UPPER ENDOSCOPIC ULTRASOUND (EUS) RADIAL (N/A)  Patient Location: PACU  Anesthesia Type: MAC  Level of Consciousness: sedated, patient cooperative and responds to stimulaton  Airway & Oxygen Therapy: Patient Spontanous Breathing and Patient connected to face mask oxgen  Post-op Assessment: Report given to PACU RN and Post -op Vital signs reviewed and stable  Post vital signs: Reviewed and stable  Complications: No apparent anesthesia complications

## 2012-11-15 ENCOUNTER — Encounter (HOSPITAL_COMMUNITY): Payer: Self-pay | Admitting: Gastroenterology

## 2012-11-16 ENCOUNTER — Other Ambulatory Visit (HOSPITAL_COMMUNITY): Payer: Self-pay | Admitting: Surgery

## 2012-11-16 ENCOUNTER — Encounter (HOSPITAL_COMMUNITY): Payer: Self-pay | Admitting: Emergency Medicine

## 2012-11-16 ENCOUNTER — Emergency Department (HOSPITAL_COMMUNITY)
Admission: EM | Admit: 2012-11-16 | Discharge: 2012-11-16 | Discharge status: Against Medical Advice | Payer: Self-pay | Attending: Emergency Medicine | Admitting: Emergency Medicine

## 2012-11-16 DIAGNOSIS — R109 Unspecified abdominal pain: Secondary | ICD-10-CM | POA: Insufficient documentation

## 2012-11-16 DIAGNOSIS — J45909 Unspecified asthma, uncomplicated: Secondary | ICD-10-CM | POA: Insufficient documentation

## 2012-11-16 DIAGNOSIS — F17200 Nicotine dependence, unspecified, uncomplicated: Secondary | ICD-10-CM | POA: Insufficient documentation

## 2012-11-16 DIAGNOSIS — R110 Nausea: Secondary | ICD-10-CM | POA: Insufficient documentation

## 2012-11-16 LAB — CBC WITH DIFFERENTIAL/PLATELET
Basophils Absolute: 0 10*3/uL (ref 0.0–0.1)
Basophils Relative: 0 % (ref 0–1)
Eosinophils Absolute: 0.3 10*3/uL (ref 0.0–0.7)
MCH: 32.2 pg (ref 26.0–34.0)
MCHC: 34.8 g/dL (ref 30.0–36.0)
Monocytes Relative: 6 % (ref 3–12)
Neutro Abs: 7.5 10*3/uL (ref 1.7–7.7)
Neutrophils Relative %: 68 % (ref 43–77)
Platelets: 340 10*3/uL (ref 150–400)
RDW: 13.2 % (ref 11.5–15.5)

## 2012-11-16 LAB — COMPREHENSIVE METABOLIC PANEL
Alkaline Phosphatase: 81 U/L (ref 39–117)
BUN: 17 mg/dL (ref 6–23)
GFR calc Af Amer: >90 mL/min (ref >90)
GFR calc non Af Amer: 79 mL/min — ABNORMAL LOW (ref >90)
Glucose, Bld: 125 mg/dL — ABNORMAL HIGH (ref 70–99)
Potassium: 4 mEq/L (ref 3.5–5.1)
Total Protein: 7.7 g/dL (ref 6.0–8.3)

## 2012-11-16 LAB — LIPASE, BLOOD: Lipase: 659 U/L — ABNORMAL HIGH (ref 11–59)

## 2012-11-16 NOTE — ED Notes (Signed)
Called pt's number and she states she is at home.  Encouraged pt to return and informed her that we had been calling to take her to a treatment room.  Pt states that she is going to have her PCP call to get results.

## 2012-11-16 NOTE — ED Notes (Signed)
Pt c/o upper abd pain; pt had sx to remove pancreatic cyst 2 days ago; pt sts some nausea

## 2012-11-16 NOTE — ED Notes (Signed)
Unable to locate pt. Christa, RN looked outside for pt as well.

## 2012-12-19 ENCOUNTER — Encounter (HOSPITAL_COMMUNITY): Payer: Self-pay | Admitting: *Deleted

## 2012-12-19 ENCOUNTER — Emergency Department (HOSPITAL_COMMUNITY)
Admission: EM | Admit: 2012-12-19 | Discharge: 2012-12-19 | Disposition: A | Discharge status: Home / Self Care | Payer: Self-pay | Source: Home / Self Care

## 2012-12-19 DIAGNOSIS — R030 Elevated blood-pressure reading, without diagnosis of hypertension: Secondary | ICD-10-CM

## 2012-12-19 DIAGNOSIS — K859 Acute pancreatitis without necrosis or infection, unspecified: Secondary | ICD-10-CM

## 2012-12-19 LAB — CBC
HCT: 40 % (ref 36.0–46.0)
Hemoglobin: 13.9 g/dL (ref 12.0–15.0)
MCV: 90.9 fL (ref 78.0–100.0)
RBC: 4.4 MIL/uL (ref 3.87–5.11)
WBC: 8.9 10*3/uL (ref 4.0–10.5)

## 2012-12-19 LAB — COMPREHENSIVE METABOLIC PANEL
AST: 13 U/L (ref 0–37)
Albumin: 3.9 g/dL (ref 3.5–5.2)
Calcium: 9.6 mg/dL (ref 8.4–10.5)
Chloride: 102 mEq/L (ref 96–112)
Creatinine, Ser: 0.85 mg/dL (ref 0.50–1.10)
Total Bilirubin: 0.2 mg/dL — ABNORMAL LOW (ref 0.3–1.2)
Total Protein: 7.4 g/dL (ref 6.0–8.3)

## 2012-12-19 MED ORDER — CLONIDINE HCL 0.1 MG PO TABS
0.2000 mg | ORAL_TABLET | ORAL | Status: AC
  Administered 2012-12-19: 0.2 mg via ORAL

## 2012-12-19 MED ORDER — CLONIDINE HCL 0.1 MG PO TABS: 0.1000 mg | ORAL_TABLET | Freq: Three times a day (TID) | ORAL | Status: DC

## 2012-12-19 MED ORDER — CLONIDINE HCL 0.1 MG PO TABS
ORAL_TABLET | ORAL | Status: AC
  Filled 2012-12-19: qty 2

## 2012-12-19 MED ORDER — OXYCODONE-ACETAMINOPHEN 7.5-325 MG PO TABS: 1.0000 | ORAL_TABLET | ORAL | Status: DC | PRN

## 2012-12-19 NOTE — ED Notes (Signed)
Patient Demographics  Melinda [name], is a 52 y.o. female  OZH:[id number]  [id number]:[id number]  DOB - [birth year]  Chief Complaint  Patient presents with  . Establish Care  . Abdominal Pain        Subjective:   Melinda [name] with history of pancreatic pseudocyst which was recently drained a few days ago by Dr. Dulce Sellar at the hospital, hypertension, whose been having intermittent bowel pains comes back for establishing care, of note 4 days ago she had to go to ER for worsening abdominal pain at that time her lipase was elevated and so was her white count, she says she left the ER before she was seen by the physicians as it was getting too late, today her pain and discomfort is much better, however she says pain is intermittent and comes and goes on a daily basis  She says pain is usually sharp epigastric area radiating to the back worse with food better with Bowel rest and pain medications, denies any fever chills, no chest pain or shortness of breath.  Objective:   Past Medical History  Diagnosis Date  . Pancreatitis   . Asthma       Past Surgical History  Procedure Laterality Date  . Cholecystectomy    . Cesarean section    . Eus N/A 11/14/2012    Procedure: FULL UPPER ENDOSCOPIC ULTRASOUND (EUS) RADIAL;  Surgeon: Willis Modena, MD;  Location: [facility];  Service: Endoscopy;  Laterality: N/A;  extended case to 90 min per Dr Dulce Sellar for definate FNA/JB     Filed Vitals:   12/19/12 1624  BP: 168/123  Pulse: 75  Temp: 99.1 F (37.3 C)  TempSrc: Oral  Resp: 18  SpO2: 99%     Exam  Awake Alert, Oriented X 3, No new F.N deficits, Normal affect [place].AT,PERRAL Supple Neck,No JVD, No cervical lymphadenopathy appriciated.  Symmetrical Chest wall movement, Good air movement bilaterally, CTAB RRR,No Gallops,Rubs or new Murmurs, No Parasternal Heave +ve B.Sounds, Abd Soft, mild epigastric tenderness, No organomegaly appriciated, No rebound - guarding or rigidity. No Cyanosis, Clubbing  or edema, No new Rash or bruise       Data Review   CBC No results found for this basename: WBC, HGB, HCT, PLT, MCV, MCH, MCHC, RDW, NEUTRABS, LYMPHSABS, MONOABS, EOSABS, BASOSABS, BANDABS, BANDSABD,  in the last 168 hours  Chemistries   No results found for this basename: NA, K, CL, CO2, GLUCOSE, BUN, CREATININE, GFRCGP, CALCIUM, MG, AST, ALT, ALKPHOS, BILITOT,  in the last 168 hours ------------------------------------------------------------------------------------------------------------------ No results found for this basename: HGBA1C,  in the last 72 hours ------------------------------------------------------------------------------------------------------------------ No results found for this basename: CHOL, HDL, LDLCALC, TRIG, CHOLHDL, LDLDIRECT,  in the last 72 hours ------------------------------------------------------------------------------------------------------------------ No results found for this basename: TSH, T4TOTAL, FREET3, T3FREE, THYROIDAB,  in the last 72 hours ------------------------------------------------------------------------------------------------------------------ No results found for this basename: VITAMINB12, FOLATE, FERRITIN, TIBC, IRON, RETICCTPCT,  in the last 72 hours  Coagulation profile  No results found for this basename: INR, PROTIME,  in the last 168 hours     Prior to Admission medications   Medication Sig Start Date End Date Taking? Authorizing Provider  cloNIDine (CATAPRES) 0.1 MG tablet Take 1 tablet (0.1 mg total) by mouth 3 (three) times daily. 12/19/12   Leroy Sea, MD  dimenhyDRINATE (DRAMAMINE) 50 MG tablet Take 50 mg by mouth every 8 (eight) hours as needed (nausea).    Historical Provider, MD  Fluticasone-Salmeterol (ADVAIR) 500-50 MCG/DOSE AEPB Inhale 1 puff into the lungs every  12 (twelve) hours.    Historical Provider, MD  oxyCODONE-acetaminophen (PERCOCET) 7.5-325 MG per tablet Take 1 tablet by mouth every 4 (four)  hours as needed for pain. 12/19/12   Leroy Sea, MD     Assessment & Plan   History of acute hepatitis or traumatic pseudocyst recently. Continues to have in the mid and abdominal pain, today she is actively pain-free, exam does show mild epigastric tenderness, 4 days ago lipase was elevated in the ER, at this time her pain medications are refilled, I have repeated a CMP and lipase, she will come back tomorrow for followup, if her numbers are looking worse than last time and she continues to feel poorly we will get her admitted. She has appointment with Dr. Dulce Sellar has been requested to followup with him for her care.   Hypertension in poor control, 0.2 mg of Catapres will be given now, blood pressure will be repeated however, his blood pressure is stable she will be discharged home on Catapres. She is coming back tomorrow for followup.    Follow-up Information   Follow up with this clinic. Schedule an appointment as soon as possible for a visit in 1 day.      Follow up with Freddy Jaksch, MD. Schedule an appointment as soon as possible for a visit in 1 week.   Contact information:   [address] [place] [address] [phone]        Leroy Sea M.D on 12/19/2012 at 4:39 PM   Leroy Sea, MD 12/19/12 [phone]

## 2012-12-19 NOTE — ED Notes (Addendum)
Patient presents to establish care. Also patient complain of "pancreas pain" and wants something to help with pain. Patient states she has been nauseated and vomiting since Saturday.

## 2013-05-27 ENCOUNTER — Ambulatory Visit: Payer: Self-pay | Attending: Family Medicine | Admitting: Internal Medicine

## 2013-05-27 VITALS — BP 170/101 | HR 76 | Temp 98.6°F | Resp 18 | Ht 62.21 in | Wt 167.0 lb

## 2013-05-27 DIAGNOSIS — D1779 Benign lipomatous neoplasm of other sites: Secondary | ICD-10-CM

## 2013-05-27 DIAGNOSIS — I10 Essential (primary) hypertension: Secondary | ICD-10-CM

## 2013-05-27 DIAGNOSIS — D171 Benign lipomatous neoplasm of skin and subcutaneous tissue of trunk: Secondary | ICD-10-CM

## 2013-05-27 MED ORDER — HYDRALAZINE HCL 25 MG PO TABS: 25.0000 mg | ORAL_TABLET | Freq: Three times a day (TID) | ORAL | Status: DC

## 2013-05-27 MED ORDER — FLUTICASONE-SALMETEROL 500-50 MCG/DOSE IN AEPB: 1.0000 | INHALATION_SPRAY | Freq: Two times a day (BID) | RESPIRATORY_TRACT | Status: DC

## 2013-05-27 NOTE — Progress Notes (Signed)
Patient ID: Melinda [name], female   DOB: 04-13-1961, 52 y.o.   MRN: [id number] PCP:  Jeanann Lewandowsky, MD   Chief Complaint:  Lump in the back  HPI: 52 year old female with history of chronic pancreatitis, hypertension (not on any medications) tobacco abuse here for symptoms of a lump over MID back 4+6 months. She reports that it started with a size of a pea and has gradually increased in size and causing her pain. The pain is placed to the lump only and does not radiate. Denies any discharge from it. Denies any fever, chills, headache, dizziness, chest pain, palpitations, abdominal pain, nausea, vomiting, bowel or urinary symptoms. She reports that she has cut down on her smoking and currently smokes 5 cigarettes a day. denies any alcohol use. she denies taking any blood pressure medications and reports that she last saw a doctor almost 6 months back  Allergies: Allergies  Allergen Reactions  . Codeine Itching and Nausea Only    Prior to Admission medications   Medication Sig Start Date End Date Taking? Authorizing Provider  dimenhyDRINATE (DRAMAMINE) 50 MG tablet Take 50 mg by mouth every 8 (eight) hours as needed (nausea).   Yes Historical Provider, MD  Fluticasone-Salmeterol (ADVAIR) 500-50 MCG/DOSE AEPB Inhale 1 puff into the lungs every 12 (twelve) hours. 05/27/13  Yes Eunie Lawn, MD  oxyCODONE-acetaminophen (PERCOCET) 7.5-325 MG per tablet Take 1 tablet by mouth every 4 (four) hours as needed for pain. 12/19/12  Yes Leroy Sea, MD  cloNIDine (CATAPRES) 0.1 MG tablet Take 1 tablet (0.1 mg total) by mouth 3 (three) times daily. 12/19/12   Leroy Sea, MD  hydrALAZINE (APRESOLINE) 25 MG tablet Take 1 tablet (25 mg total) by mouth 3 (three) times daily. 05/27/13   Eddie North, MD    Past Medical History  Diagnosis Date  . Pancreatitis   . Asthma     Past Surgical History  Procedure Laterality Date  . Cholecystectomy    . Cesarean section    . Eus N/A 11/14/2012   Procedure: FULL UPPER ENDOSCOPIC ULTRASOUND (EUS) RADIAL;  Surgeon: Willis Modena, MD;  Location: [facility];  Service: Endoscopy;  Laterality: N/A;  extended case to 90 min per Dr Dulce Sellar for definate FNA/JB    Social History:  reports that she has been smoking.  She has never used smokeless tobacco. She reports that  drinks alcohol. She reports that she does not use illicit drugs.  History reviewed. No pertinent family history.  Review of Systems:  As outlined in history of present illness  Physical Exam:  Filed Vitals:   05/27/13 1433  BP: 170/101  Pulse: 76  Temp: 98.6 F (37 C)  TempSrc: Oral  Resp: 18  Height: 5' 2.21" (1.58 m)  Weight: 167 lb (75.751 kg)  SpO2: 96%    Constitutional: Vital signs reviewed.  Patient is a well-developed and well-nourished in no acute distress and cooperative with exam. Alert and oriented x3.  HEENT: No pallor, moist total mucosa Cardiovascular: RRR, S1 normal, S2 normal, no MRG, Pulmonary/Chest: CTAB, no wheezes, rales, or rhonchi Abdominal: Soft. Non-tender, non-distended, bowel sounds are normal, Musculoskeletal: No joint deformities, erythema, or stiffness, ROM full and no nontender. 3 into 2 cm mass over mid back centrally , which is a form and mobile with some tenderness to palpation. No superficial skin discoloration or discharge. Appears to be a lipomatous lesion on exam  Ext: no edema and no cyanosis,  Hematology: no cervical, inginal, or axillary adenopathy.  Neurological: A&O  x3,  Labs on Admission:  No results found for this or any previous visit (from the past 48 hour(s)).  Radiological Exams on Admission: No results found.  Assessment/Plan Lipoma of the back We'll refer to Gen. surgery as it has been increasing in size and painful per patient.  Uncontrolled hypertension Patient has not been on any medications. I will start her on hydralazine 25 mg 2 times a day. Counselor on salt restricted diet, regular exercise and  quitting smoking. Followup in 2 weeks to have a blood pressure monitored. Check BMET and  lipid panel at that time.   Health maintenance Refer her to women's health in the next visit       Rexford Prevo 05/27/2013, 2:43 PM

## 2013-05-27 NOTE — Progress Notes (Signed)
Pt c/o paranoidal cyst onset 6 months Started out small but gradually getting bigger Sxs include: pain, swelling Denies: drainage, fevers  Alert w/no signs of acute distress.

## 2013-05-27 NOTE — Addendum Note (Signed)
Addended by: Eddie North on: 05/27/2013 04:11 PM   Modules accepted: Orders

## 2013-05-28 ENCOUNTER — Telehealth: Payer: Self-pay | Admitting: Internal Medicine

## 2013-05-28 ENCOUNTER — Other Ambulatory Visit: Payer: Self-pay | Admitting: Internal Medicine

## 2013-05-28 MED ORDER — FLUTICASONE-SALMETEROL 500-50 MCG/DOSE IN AEPB: 1.0000 | INHALATION_SPRAY | Freq: Two times a day (BID) | RESPIRATORY_TRACT | Status: DC

## 2013-05-28 NOTE — Telephone Encounter (Signed)
Pt's script sent to [facility] to [facility].

## 2013-05-28 NOTE — Telephone Encounter (Signed)
Pt needs scripts faxed to, [facility] p: [phone], f: [phone].  PT ID #[id number]. Script name Fluticasone-Salmeterol (ADVAIR) 500-50 MCG/DOSE AEPB

## 2013-06-04 ENCOUNTER — Ambulatory Visit (INDEPENDENT_AMBULATORY_CARE_PROVIDER_SITE_OTHER): Payer: Self-pay | Admitting: General Surgery

## 2013-06-04 NOTE — Telephone Encounter (Signed)
Pt's script resent to [name] to [facility] with cover sheet on 05/31/13.

## 2013-06-10 ENCOUNTER — Encounter: Payer: Self-pay | Admitting: Internal Medicine

## 2013-06-10 ENCOUNTER — Ambulatory Visit: Payer: Self-pay | Attending: Internal Medicine | Admitting: Internal Medicine

## 2013-06-10 VITALS — BP 159/85 | HR 98 | Temp 98.2°F | Resp 17 | Wt 170.2 lb

## 2013-06-10 DIAGNOSIS — Z79899 Other long term (current) drug therapy: Secondary | ICD-10-CM | POA: Insufficient documentation

## 2013-06-10 DIAGNOSIS — I10 Essential (primary) hypertension: Secondary | ICD-10-CM | POA: Insufficient documentation

## 2013-06-10 DIAGNOSIS — J45909 Unspecified asthma, uncomplicated: Secondary | ICD-10-CM | POA: Insufficient documentation

## 2013-06-10 DIAGNOSIS — Z09 Encounter for follow-up examination after completed treatment for conditions other than malignant neoplasm: Secondary | ICD-10-CM | POA: Insufficient documentation

## 2013-06-10 LAB — HEMOGLOBIN A1C
Hgb A1c MFr Bld: 5.9 % — ABNORMAL HIGH (ref <5.7)
Mean Plasma Glucose: 123 mg/dL — ABNORMAL HIGH (ref <117)

## 2013-06-10 LAB — CBC
MCH: 32.4 pg (ref 26.0–34.0)
MCHC: 34.4 g/dL (ref 30.0–36.0)
MCV: 94.2 fL (ref 78.0–100.0)
Platelets: 370 10*3/uL (ref 150–400)

## 2013-06-10 NOTE — Progress Notes (Signed)
Patient ID: Melinda [name], female   DOB: October 31, 1960, 52 y.o.   MRN: [id number] Patient Demographics  Melinda [name], is a 52 y.o. female  NWG:[id number]  [id number]:[id number]  DOB - [birth year]  Chief Complaint  Patient presents with  . Follow-up        Subjective:   Melinda [name] is a 52 y.o. female here today for a follow up visit. She has history of hypertension uncontrolled, chronic pancreatitis and asthma. Patient has all her medications, only here for blood draw today as scheduled and for blood pressure check. Blood pressure today is 159/85 mmHg. Patient continues to smoke but said she is reducing the number of cigarette per day. She does not drink alcohol because of her pancreatitis. She is demanding oxycodone for her pancreatic pain. Patient has No headache, No chest pain, No abdominal pain - No Nausea, No new weakness tingling or numbness, No Cough - SOB.  ALLERGIES:   Allergies  Allergen Reactions  . Codeine Itching and Nausea Only    PAST MEDICAL HISTORY: Past Medical History  Diagnosis Date  . Pancreatitis   . Asthma     MEDICATIONS AT HOME: Prior to Admission medications   Medication Sig Start Date End Date Taking? Authorizing Provider  dimenhyDRINATE (DRAMAMINE) 50 MG tablet Take 50 mg by mouth every 8 (eight) hours as needed (nausea).    Historical Provider, MD  Fluticasone-Salmeterol (ADVAIR) 500-50 MCG/DOSE AEPB Inhale 1 puff into the lungs every 12 (twelve) hours. 05/28/13   Nishant Dhungel, MD  hydrALAZINE (APRESOLINE) 25 MG tablet Take 1 tablet (25 mg total) by mouth 3 (three) times daily. 05/27/13   Nishant Dhungel, MD     Objective:   Filed Vitals:   06/10/13 1637  BP: 159/85  Pulse: 98  Temp: 98.2 F (36.8 C)  Resp: 17  Weight: 170 lb 3.2 oz (77.202 kg)  SpO2: 100%    Exam General appearance :Awake, alert, not in any distress. Speech Clear. Not toxic Looking HEENT: Atraumatic and Normocephalic, pupils equally reactive to light and accomodation Neck: supple, no  JVD. No cervical lymphadenopathy.  Chest:Good air entry bilaterally, no added sounds  CVS: S1 S2 regular, no murmurs.  Abdomen: Bowel sounds present, Non tender and not distended with no gaurding, rigidity or rebound. Extremities: B/L Lower Ext shows no edema, both legs are warm to touch Neurology: Awake alert, and oriented X 3, CN II-XII intact, Non focal Skin:No Rash Wounds:N/A   Data Review   CBC No results found for this basename: WBC, HGB, HCT, PLT, MCV, MCH, MCHC, RDW, NEUTRABS, LYMPHSABS, MONOABS, EOSABS, BASOSABS, BANDABS, BANDSABD,  in the last 168 hours  Chemistries   No results found for this basename: NA, K, CL, CO2, GLUCOSE, BUN, CREATININE, GFRCGP, CALCIUM, MG, AST, ALT, ALKPHOS, BILITOT,  in the last 168 hours ------------------------------------------------------------------------------------------------------------------ No results found for this basename: HGBA1C,  in the last 72 hours ------------------------------------------------------------------------------------------------------------------ No results found for this basename: CHOL, HDL, LDLCALC, TRIG, CHOLHDL, LDLDIRECT,  in the last 72 hours ------------------------------------------------------------------------------------------------------------------ No results found for this basename: TSH, T4TOTAL, FREET3, T3FREE, THYROIDAB,  in the last 72 hours ------------------------------------------------------------------------------------------------------------------ No results found for this basename: VITAMINB12, FOLATE, FERRITIN, TIBC, IRON, RETICCTPCT,  in the last 72 hours  Coagulation profile  No results found for this basename: INR, PROTIME,  in the last 168 hours    Assessment & Plan   Patient Active Problem List   Diagnosis Date Noted  . Uncontrolled hypertension 06/10/2013  . Lipoma of back 05/27/2013  . Pancreatic pseudocyst/cyst  11/10/2012  . Pancreatitis 11/08/2012  . Elevated blood pressure  11/08/2012     Plan:  Comprehensive metabolic panel  Complete blood count and depressions  Hemoglobin A1c  Lipid panel  Blood pressure goals would discuss today, patient counseled on compliance with medications The patient was extensively counseled about pain and will discuss our clinics policy on narcotics. We will not prescribe oxycodone on chronic basis  Ambulatory referral to pain clinic Patient was counseled on smoking cessation She was counseled on nutrition and exercise  Health Maintenance -Colonoscopy: Deferred next visit on patient request -Pap Smear: Deferred to next visit on patient request -Mammogram: Deferred to next visit on patient request -Vaccinations:    -Influenza: Declined by patient  Follow up in 3 months   The patient was given clear instructions to go to ER or return to medical center if symptoms don't improve, worsen or new problems develop. The patient verbalized understanding. The patient was told to call to get lab results if they haven't heard anything in the next week.    Jeanann Lewandowsky, MD, MHA, FACP [facility] [place], [place] [phone]   06/10/2013, 4:51 PM

## 2013-06-10 NOTE — Progress Notes (Signed)
Patient here for follow up on her HTN 

## 2013-06-11 LAB — BASIC METABOLIC PANEL
BUN: 17 mg/dL (ref 6–23)
CO2: 29 mEq/L (ref 19–32)
Chloride: 106 mEq/L (ref 96–112)
Creat: 1 mg/dL (ref 0.50–1.10)
Potassium: 3.8 mEq/L (ref 3.5–5.3)

## 2013-06-11 LAB — LIPID PANEL
Cholesterol: 178 mg/dL (ref 0–200)
VLDL: 30 mg/dL (ref 0–40)

## 2013-06-18 ENCOUNTER — Encounter (INDEPENDENT_AMBULATORY_CARE_PROVIDER_SITE_OTHER): Payer: Self-pay

## 2013-09-09 ENCOUNTER — Ambulatory Visit: Payer: Self-pay | Admitting: Internal Medicine

## 2014-07-02 ENCOUNTER — Ambulatory Visit (INDEPENDENT_AMBULATORY_CARE_PROVIDER_SITE_OTHER): Payer: Medicaid Other | Admitting: Family Medicine

## 2014-07-02 VITALS — BP 160/108 | HR 74 | Temp 98.4°F | Resp 16 | Ht 62.0 in | Wt 181.0 lb

## 2014-07-02 DIAGNOSIS — Z8719 Personal history of other diseases of the digestive system: Secondary | ICD-10-CM | POA: Insufficient documentation

## 2014-07-02 DIAGNOSIS — Z23 Encounter for immunization: Secondary | ICD-10-CM

## 2014-07-02 DIAGNOSIS — R635 Abnormal weight gain: Secondary | ICD-10-CM

## 2014-07-02 DIAGNOSIS — R6881 Early satiety: Secondary | ICD-10-CM | POA: Insufficient documentation

## 2014-07-02 DIAGNOSIS — F17200 Nicotine dependence, unspecified, uncomplicated: Secondary | ICD-10-CM

## 2014-07-02 DIAGNOSIS — R1013 Epigastric pain: Secondary | ICD-10-CM | POA: Insufficient documentation

## 2014-07-02 DIAGNOSIS — I10 Essential (primary) hypertension: Secondary | ICD-10-CM

## 2014-07-02 DIAGNOSIS — Z1231 Encounter for screening mammogram for malignant neoplasm of breast: Secondary | ICD-10-CM

## 2014-07-02 DIAGNOSIS — R110 Nausea: Secondary | ICD-10-CM | POA: Insufficient documentation

## 2014-07-02 DIAGNOSIS — J4520 Mild intermittent asthma, uncomplicated: Secondary | ICD-10-CM

## 2014-07-02 MED ORDER — HYDROCHLOROTHIAZIDE 25 MG PO TABS: 25.0000 mg | ORAL_TABLET | Freq: Every day | ORAL | Status: DC

## 2014-07-02 MED ORDER — PROMETHAZINE HCL 12.5 MG PO TABS: 12.5000 mg | ORAL_TABLET | Freq: Three times a day (TID) | ORAL | Status: DC | PRN

## 2014-07-02 MED ORDER — ALBUTEROL SULFATE HFA 108 (90 BASE) MCG/ACT IN AERS: 2.0000 | INHALATION_SPRAY | Freq: Four times a day (QID) | RESPIRATORY_TRACT | Status: DC | PRN

## 2014-07-02 NOTE — Progress Notes (Signed)
Subjective:    Patient ID: Melinda [name], female    DOB: [birth year], 53 y.o.   MRN: [id number]  HPI Melinda [name] a 53 year old female with a history of hypertension and chronic pancreatitis presents to establish care. She was followed by Dr. Claris Gower for many years prior to losing health insurance.    She reports a history of chronic pancreatitis. Melinda [name] states that she had a 17 day hospital stay 1 year ago due to pancreatitis. She states that she has not been able to eat a variety of foods. She states that she feels full quickly and has frequent nausea. She has been taking OTC Dramamine for periodic nausea with moderate relief. Also, she limits diet to foods that she can tolerate to alleviate symptoms.   Patient complains of epigastric pain. The pain is described as intermittent and radiating, and is 3/10 in intensity. Pain is located above the umbilicus and right upper quadrant.  Initial onset was greater than 1 year ago. Symptoms have been improved since last hospital stay. Aggravating factors include overeating, high fat meals, and specific foods. .  Alleviating factors are anti-nausea medications. The patient denies headache, fever, shortness of breath, vomiting, or diarrhea.   Patient is also here for hypertension. She is not exercising and is adherent to low salt diet.  Blood pressure is well controlled at home.  Patient denies chest pain, dyspnea, lower extremity edema, orthopnea, palpitations and syncope.  Cardiovascular risk factors: obesity (BMI >= 30 kg/m2) and sedentary lifestyle.   Past Medical History  Diagnosis Date  . Pancreatitis   . Asthma    Review of Systems  Constitutional: Positive for fatigue and unexpected weight change (20 pounds since June).  Respiratory: Positive for cough. Negative for chest tightness and wheezing.   Gastrointestinal: Positive for nausea, abdominal pain, diarrhea and abdominal distention. Negative for blood in stool.  Musculoskeletal: Negative.    Skin: Negative.   Allergic/Immunologic: Positive for environmental allergies.  Neurological: Negative for dizziness, tremors, seizures and headaches.  Hematological: Negative.   Psychiatric/Behavioral: Negative.        Objective:   Physical Exam  Constitutional: She is oriented to person, place, and time. She appears well-developed and well-nourished.  HENT:  Head: Normocephalic and atraumatic.  Right Ear: External ear normal.  Eyes: Conjunctivae are normal. Pupils are equal, round, and reactive to light.  Neck: Normal range of motion. Neck supple.  Cardiovascular: Normal rate, regular rhythm and normal heart sounds.   Pulmonary/Chest: Effort normal and breath sounds normal.  Abdominal: Soft. Bowel sounds are normal.  Musculoskeletal: Normal range of motion.  Neurological: She is alert and oriented to person, place, and time. She has normal reflexes.  Skin: Skin is warm and dry.  Psychiatric: She has a normal mood and affect. Her behavior is normal. Judgment and thought content normal.       BP 160/108  Pulse 74  Temp(Src) 98.4 F (36.9 C) (Oral)  Resp 16  Ht 5\' 2"  (1.575 m)  Wt 181 lb (82.101 kg)  BMI 33.10 kg/m2 Assessment & Plan:   1. Early satiety - COMPLETE METABOLIC PANEL WITH GFR - Ambulatory referral to Gastroenterology  2. Nausea Patient reports periodic nausea.   3. Abdominal pain, epigastric - CBC with Differential - COMPLETE METABOLIC PANEL WITH GFR - Ambulatory referral to Gastroenterology  4. History of chronic pancreatitis - Ambulatory referral to Gastroenterology - Amylase - Lipase  5. Uncontrolled hypertension Return in 1 week for BP check - hydrochlorothiazide (  HYDRODIURIL) 25 MG tablet; Take 1 tablet (25 mg total) by mouth daily.  Dispense: 30 tablet; Refill: 0 - EKG 12-Lead    6. Abnormal weight gain  - Hemoglobin A1c - TSH  7. Asthma, chronic, mild intermittent, uncomplicated - albuterol (PROVENTIL HFA;VENTOLIN HFA) 108 (90  BASE) MCG/ACT inhaler; Inhale 2 puffs into the lungs every 6 (six) hours as needed for wheezing or shortness of breath.  Dispense: 1 Inhaler; Refill: 0  8. Need for immunization against influenza - Flu Vaccine QUAD 36+ mos IM (Fluarix)  9. Immunization due - Pneumococcal polysaccharide vaccine 23-valent greater than or equal to 2yo subcutaneous/IM  10. Visit for screening mammogram  - MM DIGITAL SCREENING BILATERAL; Future  11. Tobacco dependence Patient reports that she is not ready to quit smoking. Smoking cessation instruction/counseling given:  counseled patient on the dangers of tobacco use, advised patient to stop smoking, and reviewed strategies to maximize success  Tobacco dependence:  Pap smear: Dr. Arelia Sneddon in [place] 2years ago, normal per patient Tetanus: 2012 Dorena Dew, FNP

## 2014-07-03 LAB — CBC WITH DIFFERENTIAL/PLATELET
Basophils Absolute: 0 10*3/uL (ref 0.0–0.1)
Basophils Relative: 0 % (ref 0–1)
EOS ABS: 0.3 10*3/uL (ref 0.0–0.7)
Eosinophils Relative: 4 % (ref 0–5)
HEMATOCRIT: 44.2 % (ref 36.0–46.0)
HEMOGLOBIN: 15.2 g/dL — AB (ref 12.0–15.0)
LYMPHS ABS: 1.9 10*3/uL (ref 0.7–4.0)
Lymphocytes Relative: 23 % (ref 12–46)
MCH: 33.9 pg (ref 26.0–34.0)
MCHC: 34.4 g/dL (ref 30.0–36.0)
MCV: 98.7 fL (ref 78.0–100.0)
MONOS PCT: 8 % (ref 3–12)
Monocytes Absolute: 0.7 10*3/uL (ref 0.1–1.0)
NEUTROS ABS: 5.4 10*3/uL (ref 1.7–7.7)
NEUTROS PCT: 65 % (ref 43–77)
Platelets: 334 10*3/uL (ref 150–400)
RBC: 4.48 MIL/uL (ref 3.87–5.11)
RDW: 13.7 % (ref 11.5–15.5)
WBC: 8.3 10*3/uL (ref 4.0–10.5)

## 2014-07-03 LAB — COMPLETE METABOLIC PANEL WITH GFR
ALBUMIN: 4.3 g/dL (ref 3.5–5.2)
ALT: 44 U/L — ABNORMAL HIGH (ref 0–35)
AST: 27 U/L (ref 0–37)
Alkaline Phosphatase: 76 U/L (ref 39–117)
BUN: 16 mg/dL (ref 6–23)
CO2: 27 meq/L (ref 19–32)
Calcium: 9.8 mg/dL (ref 8.4–10.5)
Chloride: 104 mEq/L (ref 96–112)
Creat: 0.86 mg/dL (ref 0.50–1.10)
GFR, EST NON AFRICAN AMERICAN: 78 mL/min
GLUCOSE: 92 mg/dL (ref 70–99)
POTASSIUM: 4.2 meq/L (ref 3.5–5.3)
SODIUM: 140 meq/L (ref 135–145)
TOTAL PROTEIN: 7.1 g/dL (ref 6.0–8.3)
Total Bilirubin: 0.4 mg/dL (ref 0.2–1.2)

## 2014-07-03 LAB — HEMOGLOBIN A1C
Hgb A1c MFr Bld: 5.8 % — ABNORMAL HIGH (ref <5.7)
MEAN PLASMA GLUCOSE: 120 mg/dL — AB (ref <117)

## 2014-07-03 LAB — LIPASE: Lipase: 50 U/L (ref 0–75)

## 2014-07-03 LAB — AMYLASE: Amylase: 46 U/L (ref 0–105)

## 2014-07-03 LAB — TSH: TSH: 1.369 u[IU]/mL (ref 0.350–4.500)

## 2014-07-04 ENCOUNTER — Telehealth: Payer: Self-pay | Admitting: Internal Medicine

## 2014-07-04 NOTE — Telephone Encounter (Signed)
Patient needs another GI referral. [facility] is not taking any more medicaid this month.

## 2014-07-06 ENCOUNTER — Encounter: Payer: Self-pay | Admitting: Family Medicine

## 2014-07-07 ENCOUNTER — Telehealth: Payer: Self-pay

## 2014-07-07 ENCOUNTER — Ambulatory Visit: Payer: Medicaid Other

## 2014-07-07 VITALS — BP 158/90 | HR 102

## 2014-07-07 DIAGNOSIS — I10 Essential (primary) hypertension: Secondary | ICD-10-CM

## 2014-07-07 NOTE — Telephone Encounter (Signed)
CALLED AND SPOKE WITH PATIENT. APPOINTMENT SCHEDULED FOR 07/09/2014 @ 2:45PM.

## 2014-07-09 ENCOUNTER — Other Ambulatory Visit: Payer: Self-pay | Admitting: Gastroenterology

## 2014-07-09 DIAGNOSIS — R1013 Epigastric pain: Secondary | ICD-10-CM

## 2014-07-14 ENCOUNTER — Ambulatory Visit
Admission: RE | Admit: 2014-07-14 | Discharge: 2014-07-14 | Disposition: A | Discharge status: Home / Self Care | Payer: Medicaid Other | Source: Ambulatory Visit | Attending: Gastroenterology | Admitting: Gastroenterology

## 2014-07-14 DIAGNOSIS — R1013 Epigastric pain: Secondary | ICD-10-CM

## 2014-07-14 MED ORDER — IOHEXOL 300 MG/ML  SOLN
100.0000 mL | Freq: Once | INTRAMUSCULAR | Status: AC | PRN
  Administered 2014-07-14: 100 mL via INTRAVENOUS

## 2014-07-15 ENCOUNTER — Other Ambulatory Visit: Payer: Self-pay | Admitting: Gastroenterology

## 2014-07-15 ENCOUNTER — Encounter (INDEPENDENT_AMBULATORY_CARE_PROVIDER_SITE_OTHER): Payer: Self-pay | Admitting: *Deleted

## 2014-07-16 ENCOUNTER — Encounter (HOSPITAL_COMMUNITY): Payer: Self-pay | Admitting: Pharmacy Technician

## 2014-07-16 ENCOUNTER — Encounter (HOSPITAL_COMMUNITY): Payer: Self-pay | Admitting: *Deleted

## 2014-07-17 NOTE — Telephone Encounter (Signed)
Faxed to [facility]. Thanks!

## 2014-07-18 ENCOUNTER — Encounter (HOSPITAL_COMMUNITY)
Admission: RE | Disposition: A | Discharge status: Home / Self Care | Payer: Self-pay | Source: Ambulatory Visit | Attending: Gastroenterology

## 2014-07-18 ENCOUNTER — Encounter (HOSPITAL_COMMUNITY): Payer: Self-pay | Admitting: Certified Registered Nurse Anesthetist

## 2014-07-18 ENCOUNTER — Ambulatory Visit (HOSPITAL_COMMUNITY)
Admission: RE | Admit: 2014-07-18 | Discharge: 2014-07-18 | Disposition: A | Discharge status: Home / Self Care | Payer: Medicaid Other | Source: Ambulatory Visit | Attending: Gastroenterology | Admitting: Gastroenterology

## 2014-07-18 ENCOUNTER — Ambulatory Visit (HOSPITAL_COMMUNITY): Payer: Medicaid Other | Admitting: Anesthesiology

## 2014-07-18 ENCOUNTER — Encounter (HOSPITAL_COMMUNITY): Payer: Medicaid Other | Admitting: Anesthesiology

## 2014-07-18 DIAGNOSIS — K861 Other chronic pancreatitis: Secondary | ICD-10-CM | POA: Insufficient documentation

## 2014-07-18 DIAGNOSIS — R635 Abnormal weight gain: Secondary | ICD-10-CM | POA: Insufficient documentation

## 2014-07-18 DIAGNOSIS — J45909 Unspecified asthma, uncomplicated: Secondary | ICD-10-CM | POA: Diagnosis not present

## 2014-07-18 DIAGNOSIS — I10 Essential (primary) hypertension: Secondary | ICD-10-CM | POA: Insufficient documentation

## 2014-07-18 DIAGNOSIS — R6881 Early satiety: Secondary | ICD-10-CM | POA: Insufficient documentation

## 2014-07-18 DIAGNOSIS — F17210 Nicotine dependence, cigarettes, uncomplicated: Secondary | ICD-10-CM | POA: Diagnosis not present

## 2014-07-18 DIAGNOSIS — K862 Cyst of pancreas: Secondary | ICD-10-CM | POA: Diagnosis not present

## 2014-07-18 HISTORY — DX: Headache, unspecified: R51.9

## 2014-07-18 HISTORY — PX: EUS: SHX5427

## 2014-07-18 HISTORY — DX: Gastro-esophageal reflux disease without esophagitis: K21.9

## 2014-07-18 HISTORY — DX: Unspecified osteoarthritis, unspecified site: M19.90

## 2014-07-18 HISTORY — DX: Personal history of other medical treatment: Z92.89

## 2014-07-18 HISTORY — DX: Essential (primary) hypertension: I10

## 2014-07-18 HISTORY — DX: Headache: R51

## 2014-07-18 SURGERY — UPPER ENDOSCOPIC ULTRASOUND (EUS) LINEAR
Anesthesia: Monitor Anesthesia Care

## 2014-07-18 MED ORDER — PROPOFOL 10 MG/ML IV BOLUS
INTRAVENOUS | Status: AC
  Filled 2014-07-18: qty 20

## 2014-07-18 MED ORDER — PROPOFOL 10 MG/ML IV BOLUS
INTRAVENOUS | Status: DC | PRN
  Administered 2014-07-18: 40 mg via INTRAVENOUS
  Administered 2014-07-18 (×3): 20 mg via INTRAVENOUS
  Administered 2014-07-18 (×2): 40 mg via INTRAVENOUS
  Administered 2014-07-18: 80 mg via INTRAVENOUS
  Administered 2014-07-18: 25 mg via INTRAVENOUS
  Administered 2014-07-18: 80 mg via INTRAVENOUS
  Administered 2014-07-18: 20 mg via INTRAVENOUS
  Administered 2014-07-18: 40 mg via INTRAVENOUS

## 2014-07-18 MED ORDER — LACTATED RINGERS IV SOLN
INTRAVENOUS | Status: DC | PRN
  Administered 2014-07-18: 08:00:00 via INTRAVENOUS

## 2014-07-18 MED ORDER — SODIUM CHLORIDE 0.9 % IV SOLN: INTRAVENOUS | Status: DC

## 2014-07-18 MED ORDER — BUTAMBEN-TETRACAINE-BENZOCAINE 2-2-14 % EX AERO
INHALATION_SPRAY | CUTANEOUS | Status: DC | PRN
  Administered 2014-07-18: 1 via TOPICAL

## 2014-07-18 MED ORDER — ESMOLOL HCL 10 MG/ML IV SOLN
INTRAVENOUS | Status: DC | PRN
  Administered 2014-07-18 (×2): 10 mg via INTRAVENOUS

## 2014-07-18 MED ORDER — LACTATED RINGERS IV SOLN
INTRAVENOUS | Status: DC
  Administered 2014-07-18: 1000 mL via INTRAVENOUS

## 2014-07-18 NOTE — Anesthesia Preprocedure Evaluation (Addendum)
Anesthesia Evaluation  Patient identified by MRN, date of birth, ID band Patient awake    Reviewed: Allergy & Precautions, H&P , NPO status , Patient's Chart, lab work & pertinent test results  History of Anesthesia Complications Negative for: history of anesthetic complications  Airway Mallampati: II  TM Distance: >3 FB Neck ROM: Full    Dental  (+) Partial Upper, Dental Advisory Given   Pulmonary asthma , Current Smoker,  breath sounds clear to auscultation  Pulmonary exam normal       Cardiovascular hypertension, Pt. on medications Rhythm:Regular Rate:Normal     Neuro/Psych  Headaches, negative psych ROS   GI/Hepatic Neg liver ROS, GERD-  Medicated and Controlled,  Endo/Other  negative endocrine ROS  Renal/GU negative Renal ROS  negative genitourinary   Musculoskeletal  (+) Arthritis -,   Abdominal   Peds negative pediatric ROS (+)  Hematology negative hematology ROS (+)   Anesthesia Other Findings   Reproductive/Obstetrics negative OB ROS                            Anesthesia Physical Anesthesia Plan  ASA: II  Anesthesia Plan: MAC   Post-op Pain Management:    Induction: Intravenous  Airway Management Planned: Nasal Cannula  Additional Equipment:   Intra-op Plan:   Post-operative Plan: Extubation in OR  Informed Consent: I have reviewed the patients History and Physical, chart, labs and discussed the procedure including the risks, benefits and alternatives for the proposed anesthesia with the patient or authorized representative who has indicated his/her understanding and acceptance.   Dental advisory given  Plan Discussed with: CRNA  Anesthesia Plan Comments:         Anesthesia Quick Evaluation

## 2014-07-18 NOTE — Interval H&P Note (Signed)
History and Physical Interval Note:  07/18/2014 7:42 AM  Melinda [name]  has presented today for surgery, with the diagnosis of pancreatitis  The various methods of treatment have been discussed with the patient and family. After consideration of risks, benefits and other options for treatment, the patient has consented to  Procedure(s): UPPER ENDOSCOPIC ULTRASOUND (EUS) LINEAR (N/A) as a surgical intervention .  The patient's history has been reviewed, patient examined, no change in status, stable for surgery.  I have reviewed the patient's chart and labs.  Questions were answered to the patient's satisfaction.     Sarim Rothman D

## 2014-07-18 NOTE — Transfer of Care (Signed)
Immediate Anesthesia Transfer of Care Note  Patient: Melinda [name]  Procedure(s) Performed: Procedure(s): UPPER ENDOSCOPIC ULTRASOUND (EUS) LINEAR (N/A)  Patient Location: PACU and Endoscopy Unit  Anesthesia Type:MAC  Level of Consciousness: awake, alert , oriented and patient cooperative  Airway & Oxygen Therapy: Patient Spontanous Breathing and Patient connected to nasal cannula oxygen  Post-op Assessment: Report given to PACU RN, Post -op Vital signs reviewed and stable and Patient moving all extremities  Post vital signs: Reviewed and stable  Complications: No apparent anesthesia complications

## 2014-07-18 NOTE — H&P (View-Only) (Signed)
Subjective:    Patient ID: Melinda [name], female    DOB: [birth year], 53 y.o.   MRN: [id number]  HPI Melinda [name] a 53 year old female with a history of hypertension and chronic pancreatitis presents to establish care. She was followed by Dr. Claris Gower for many years prior to losing health insurance.    She reports a history of chronic pancreatitis. Melinda [name] states that she had a 17 day hospital stay 1 year ago due to pancreatitis. She states that she has not been able to eat a variety of foods. She states that she feels full quickly and has frequent nausea. She has been taking OTC Dramamine for periodic nausea with moderate relief. Also, she limits diet to foods that she can tolerate to alleviate symptoms.   Patient complains of epigastric pain. The pain is described as intermittent and radiating, and is 3/10 in intensity. Pain is located above the umbilicus and right upper quadrant.  Initial onset was greater than 1 year ago. Symptoms have been improved since last hospital stay. Aggravating factors include overeating, high fat meals, and specific foods. .  Alleviating factors are anti-nausea medications. The patient denies headache, fever, shortness of breath, vomiting, or diarrhea.   Patient is also here for hypertension. She is not exercising and is adherent to low salt diet.  Blood pressure is well controlled at home.  Patient denies chest pain, dyspnea, lower extremity edema, orthopnea, palpitations and syncope.  Cardiovascular risk factors: obesity (BMI >= 30 kg/m2) and sedentary lifestyle.   Past Medical History  Diagnosis Date  . Pancreatitis   . Asthma    Review of Systems  Constitutional: Positive for fatigue and unexpected weight change (20 pounds since June).  Respiratory: Positive for cough. Negative for chest tightness and wheezing.   Gastrointestinal: Positive for nausea, abdominal pain, diarrhea and abdominal distention. Negative for blood in stool.  Musculoskeletal: Negative.    Skin: Negative.   Allergic/Immunologic: Positive for environmental allergies.  Neurological: Negative for dizziness, tremors, seizures and headaches.  Hematological: Negative.   Psychiatric/Behavioral: Negative.        Objective:   Physical Exam  Constitutional: She is oriented to person, place, and time. She appears well-developed and well-nourished.  HENT:  Head: Normocephalic and atraumatic.  Right Ear: External ear normal.  Eyes: Conjunctivae are normal. Pupils are equal, round, and reactive to light.  Neck: Normal range of motion. Neck supple.  Cardiovascular: Normal rate, regular rhythm and normal heart sounds.   Pulmonary/Chest: Effort normal and breath sounds normal.  Abdominal: Soft. Bowel sounds are normal.  Musculoskeletal: Normal range of motion.  Neurological: She is alert and oriented to person, place, and time. She has normal reflexes.  Skin: Skin is warm and dry.  Psychiatric: She has a normal mood and affect. Her behavior is normal. Judgment and thought content normal.       BP 160/108  Pulse 74  Temp(Src) 98.4 F (36.9 C) (Oral)  Resp 16  Ht 5\' 2"  (1.575 m)  Wt 181 lb (82.101 kg)  BMI 33.10 kg/m2 Assessment & Plan:   1. Early satiety - COMPLETE METABOLIC PANEL WITH GFR - Ambulatory referral to Gastroenterology  2. Nausea Patient reports periodic nausea.   3. Abdominal pain, epigastric - CBC with Differential - COMPLETE METABOLIC PANEL WITH GFR - Ambulatory referral to Gastroenterology  4. History of chronic pancreatitis - Ambulatory referral to Gastroenterology - Amylase - Lipase  5. Uncontrolled hypertension Return in 1 week for BP check - hydrochlorothiazide (  HYDRODIURIL) 25 MG tablet; Take 1 tablet (25 mg total) by mouth daily.  Dispense: 30 tablet; Refill: 0 - EKG 12-Lead    6. Abnormal weight gain  - Hemoglobin A1c - TSH  7. Asthma, chronic, mild intermittent, uncomplicated - albuterol (PROVENTIL HFA;VENTOLIN HFA) 108 (90  BASE) MCG/ACT inhaler; Inhale 2 puffs into the lungs every 6 (six) hours as needed for wheezing or shortness of breath.  Dispense: 1 Inhaler; Refill: 0  8. Need for immunization against influenza - Flu Vaccine QUAD 36+ mos IM (Fluarix)  9. Immunization due - Pneumococcal polysaccharide vaccine 23-valent greater than or equal to 2yo subcutaneous/IM  10. Visit for screening mammogram  - MM DIGITAL SCREENING BILATERAL; Future  11. Tobacco dependence Patient reports that she is not ready to quit smoking. Smoking cessation instruction/counseling given:  counseled patient on the dangers of tobacco use, advised patient to stop smoking, and reviewed strategies to maximize success  Tobacco dependence:  Pap smear: Dr. Arelia Sneddon in [place] 2years ago, normal per patient Tetanus: 2012 Melinda Dew, FNP

## 2014-07-18 NOTE — Op Note (Addendum)
[facility] [place], [address]   UPPER ENDOSCOPIC ULTRASOUND PROCEDURE REPORT     EXAM DATE: 07/18/2014  PATIENT NAME:          Melinda [name], Melinda [name]          MR#:        [id number]  BIRTHDATE:       10/21/1960     VISIT #:     [id number] ATTENDING:     Carol Ada, MD     STATUS:     outpatient ASSISTANT:      William Dalton and Karie Soda MD: ASA CLASS:        Class III  INDICATIONS:  The patient is a 53 yr old female here for a lower endoscopic ultrasound due to upper abdominal pain and pancreatic cyst. PROCEDURE PERFORMED:     Upper EUS  MEDICATIONS:     Monitored anesthesia care  CONSENT: The patient understands the risks and benefits of the procedure and understands that these risks include, but are not limited to: sedation, allergic reaction, infection, perforation and/or bleeding. Alternative means of evaluation and treatment include, among others: physical exam, x-rays, and/or surgical intervention. The patient elects to proceed with this endoscopic procedure.  DESCRIPTION OF PROCEDURE: During intra-op preparation period all mechanical & medical equipment was checked for proper function. Hand hygiene and appropriate measures for infection prevention was taken. After the risks, benefits and alternatives of the procedure were thoroughly explained, Informed consent was verified, confirmed and timeout was successfully executed by the treatment team. The patient was then placed in the left, lateral, decubitus position and IV sedation was administered. Throughout the procedure, the patients blood pressure, pulse and oxygen saturations were monitored continuously. Under direct visualization, the    was introduced through the mouth and advanced to the bulb of duodenum. Water was used as necessary to provide an acoustic interface. The pulse, BP, and O2 saturation were monitored and documented by the physician and nursing staff  throughout the procedure. Upon completion of the imaging, water was removed and the patient was then discharged to recovery in stable condition with the appropriate post procedure care.   FINDINGS: The procedure was difficult to perform as she had an URI and she was very restless, even with propofol.  The pancreatic parenchyma exhibited features of chronic pancreatitis with hyperechogenic strands, hyperechogenic foci, hyperechoic PD.  In the PD there was evidence of a 1.4 cm hypoechoic cyst.  In the had of the pancreas the large 2.5 cm cyst was identified and  there was evidence of some debris in the cyst.  The wall was hyperechoic and there was a ? of a couple of mural nodules.  An attempt to perform an FNA cyst aspiration was attempted, but she was too restless to accomplish this intervention and the prodecure was abandoned. There was evidence of some mild extrinsic compression of the pylorus, but the area was patent.    ADVERSE EVENTS:     There were no immediate complications. IMPRESSIONS:     1) Pancreatic head cyst and tail cyst. 2) Features of chronic pancreatitis.  RECOMMENDATIONS:     1) Reschedule EUS with FNA under general anesthesia.  ___________________________________ Carol Ada, MD eSigned:  Carol Ada, MD 07/18/2014 9:14 AM   cc:      PATIENT NAME:  Melinda [name], Melinda [name] MR#: [id number]

## 2014-07-18 NOTE — Anesthesia Postprocedure Evaluation (Signed)
  Anesthesia Post-op Note  Patient: Melinda [name]  Procedure(s) Performed: Procedure(s) (LRB): UPPER ENDOSCOPIC ULTRASOUND (EUS) LINEAR (N/A)  Patient Location: PACU  Anesthesia Type: MAC  Level of Consciousness: awake and alert   Airway and Oxygen Therapy: Patient Spontanous Breathing  Post-op Pain: mild  Post-op Assessment: Post-op Vital signs reviewed, Patient's Cardiovascular Status Stable, Respiratory Function Stable, Patent Airway and No signs of Nausea or vomiting  Last Vitals:  Filed Vitals:   07/18/14 0940  BP: 160/80  Pulse: 74  Temp:   Resp: 17    Post-op Vital Signs: stable   Complications: No apparent anesthesia complications

## 2014-07-18 NOTE — Discharge Instructions (Signed)
Gastrointestinal Endoscopy, Care After °Refer to this sheet in the next few weeks. These instructions provide you with information on caring for yourself after your procedure. Your caregiver may also give you more specific instructions. Your treatment has been planned according to current medical practices, but problems sometimes occur. Call your caregiver if you have any problems or questions after your procedure. °HOME CARE INSTRUCTIONS °· If you were given medicine to help you relax (sedative), do not drive, operate machinery, or sign important documents for 24 hours. °· Avoid alcohol and hot or warm beverages for the first 24 hours after the procedure. °· Only take over-the-counter or prescription medicines for pain, discomfort, or fever as directed by your caregiver. You may resume taking your normal medicines unless your caregiver tells you otherwise. Ask your caregiver when you may resume taking medicines that may cause bleeding, such as aspirin, clopidogrel, or warfarin. °· You may return to your normal diet and activities on the day after your procedure, or as directed by your caregiver. Walking may help to reduce any bloated feeling in your abdomen. °· Drink enough fluids to keep your urine clear or pale yellow. °· You may gargle with salt water if you have a sore throat. °SEEK IMMEDIATE MEDICAL CARE IF: °· You have severe nausea or vomiting. °· You have severe abdominal pain, abdominal cramps that last longer than 6 hours, or abdominal swelling (distention). °· You have severe shoulder or back pain. °· You have trouble swallowing. °· You have shortness of breath, your breathing is shallow, or you are breathing faster than normal. °· You have a fever or a rapid heartbeat. °· You vomit blood or material that looks like coffee grounds. °· You have bloody, black, or tarry stools. °MAKE SURE YOU: °· Understand these instructions. °· Will watch your condition. °· Will get help right away if you are not doing  well or get worse. °Document Released: 04/19/2004 Document Revised: 01/20/2014 Document Reviewed: 12/06/2011 °ExitCare® Patient Information ©2015 [employer]. This information is not intended to replace advice given to you by your health care provider. Make sure you discuss any questions you have with your health care provider. ° °

## 2014-07-21 ENCOUNTER — Encounter (HOSPITAL_COMMUNITY): Payer: Self-pay | Admitting: Gastroenterology

## 2014-07-21 ENCOUNTER — Ambulatory Visit (INDEPENDENT_AMBULATORY_CARE_PROVIDER_SITE_OTHER): Payer: Medicaid Other | Admitting: Family Medicine

## 2014-07-21 VITALS — BP 138/90 | HR 93 | Temp 98.4°F | Resp 16 | Ht 62.0 in | Wt 178.0 lb

## 2014-07-21 DIAGNOSIS — Z8719 Personal history of other diseases of the digestive system: Secondary | ICD-10-CM

## 2014-07-21 DIAGNOSIS — I10 Essential (primary) hypertension: Secondary | ICD-10-CM

## 2014-07-21 DIAGNOSIS — F17200 Nicotine dependence, unspecified, uncomplicated: Secondary | ICD-10-CM | POA: Insufficient documentation

## 2014-07-21 MED ORDER — HYDROCHLOROTHIAZIDE 25 MG PO TABS: 25.0000 mg | ORAL_TABLET | Freq: Every day | ORAL | Status: DC

## 2014-07-21 NOTE — Progress Notes (Signed)
Subjective:    Patient ID: Melinda [name], female    DOB: Jun 10, 1961, 53 y.o.   MRN: [id number]  HPI   Patient here for 1 month follow-up of hypertension. She is not exercising and is not adherent to low salt diet.  Blood pressure is not well controlled at home. She reports that blood pressure was elevated during her endoscopy 1 week ago and normalized following endoscopy. Patient denies chest pain, dyspnea, fatigue, lower extremity edema, near-syncope, palpitations and tachypnea.  Cardiovascular risk factors: obesity (BMI >= 30 kg/m2), sedentary lifestyle and smoking/ tobacco exposure.   Patient has a history of chronic pancreatitis. She recently had a CT of the abdomen, which revealed a pancreatic cyst on 07/14/2014. She reports that she has a follow up appointment with Dr. Carol Ada, gastroenterology to discuss surgery. Patient also had an upper endoscopy on 07/18/2014 by Dr. Benson Norway.  Symptoms have been present several months. Symptoms include abdominal pain, dysphagia, early satiety, heartburn and nausea. Symptoms have been unchanged.  The pain is located in the epigastrium, is described as aching, intermittent, and is rated worst day -6/ 10. Aggravating factors include food ingestion.    Review of Systems  Constitutional: Negative.   HENT: Negative.   Eyes: Negative.   Respiratory: Negative.   Cardiovascular: Negative.   Gastrointestinal: Positive for nausea. Negative for vomiting, diarrhea, constipation and rectal pain.  Genitourinary: Negative.   Musculoskeletal: Negative.   Skin: Negative.   Allergic/Immunologic: Negative.   Neurological: Negative.  Negative for dizziness and weakness.  Hematological: Negative.   Psychiatric/Behavioral: Positive for self-injury.       Objective:   Physical Exam  Constitutional: She is oriented to person, place, and time. Vital signs are normal. She appears well-developed and well-nourished.  HENT:  Head: Normocephalic and atraumatic.  Right  Ear: Hearing, tympanic membrane, external ear and ear canal normal.  Left Ear: Hearing, tympanic membrane, external ear and ear canal normal.  Nose: Nose normal.  Mouth/Throat: Uvula is midline. She has dentures.  Eyes: Conjunctivae and lids are normal. Pupils are equal, round, and reactive to light. Lids are everted and swept, no foreign bodies found. No scleral icterus.  Neck: Normal range of motion. Neck supple.  Cardiovascular: Normal rate, regular rhythm and normal heart sounds.   Abdominal: Soft. Bowel sounds are normal. There is tenderness. There is guarding.  Musculoskeletal: Normal range of motion.  Neurological: She is alert and oriented to person, place, and time. She has normal reflexes. No cranial nerve deficit or sensory deficit.  Skin: Skin is warm and dry.  Psychiatric: She has a normal mood and affect. Her behavior is normal. Judgment and thought content normal.         BP 138/90 mmHg  Pulse 93  Temp(Src) 98.4 F (36.9 C) (Oral)  Resp 16  Ht 5\' 2"  (1.575 m)  Wt 178 lb (80.74 kg)  BMI 32.55 kg/m2 Assessment & Plan:   1. Uncontrolled hypertension Blood pressure has improved since adding Hydrochlorothiazide 25 mg daily. She states that her blood pressure increased during her endoscopy, but normalized prior to discharge. She states that she is taking medication consistently, but did not take it the morning of her procedure. Will continue to monitor blood pressure closely.  Patient has a follow up appointment with Dr. Benson Norway 2.  History of chronic pancreatitis Ms. Cancel had a upper endoscopy on 07/18/2014 and and CT of the abdomen on 07/14/2014. Reviewed notes. She reports that she has a follow up scheduled with Dr.  Hung.   3. Tobacco dependence Smoking cessation instruction/counseling given:  counseled patient on the dangers of tobacco use, advised patient to stop smoking, and reviewed strategies to maximize success. She is not ready to quit and is not interested in  interventions today.    RTC: 2 months with Dr. Zigmund Daniel for hypertension Dorena Dew, FNP

## 2014-07-21 NOTE — Patient Instructions (Addendum)
DASH Eating Plan DASH stands for "Dietary Approaches to Stop Hypertension." The DASH eating plan is a healthy eating plan that has been shown to reduce high blood pressure (hypertension). Additional health benefits may include reducing the risk of type 2 diabetes mellitus, heart disease, and stroke. The DASH eating plan may also help with weight loss. WHAT DO I NEED TO KNOW ABOUT THE DASH EATING PLAN? For the DASH eating plan, you will follow these general guidelines:  Choose foods with a percent daily value for sodium of less than 5% (as listed on the food label).  Use salt-free seasonings or herbs instead of table salt or sea salt.  Check with your health care provider or pharmacist before using salt substitutes.  Eat lower-sodium products, often labeled as "lower sodium" or "no salt added."  Eat fresh foods.  Eat more vegetables, fruits, and low-fat dairy products.  Choose whole grains. Look for the word "whole" as the first word in the ingredient list.  Choose fish and skinless chicken or turkey more often than red meat. Limit fish, poultry, and meat to 6 oz (170 g) each day.  Limit sweets, desserts, sugars, and sugary drinks.  Choose heart-healthy fats.  Limit cheese to 1 oz (28 g) per day.  Eat more home-cooked food and less restaurant, buffet, and fast food.  Limit fried foods.  Cook foods using methods other than frying.  Limit canned vegetables. If you do use them, rinse them well to decrease the sodium.  When eating at a restaurant, ask that your food be prepared with less salt, or no salt if possible. WHAT FOODS CAN I EAT? Seek help from a dietitian for individual calorie needs. Grains Whole grain or whole wheat bread. Brown rice. Whole grain or whole wheat pasta. Quinoa, bulgur, and whole grain cereals. Low-sodium cereals. Corn or whole wheat flour tortillas. Whole grain cornbread. Whole grain crackers. Low-sodium crackers. Vegetables Fresh or frozen vegetables  (raw, steamed, roasted, or grilled). Low-sodium or reduced-sodium tomato and vegetable juices. Low-sodium or reduced-sodium tomato sauce and paste. Low-sodium or reduced-sodium canned vegetables.  Fruits All fresh, canned (in natural juice), or frozen fruits. Meat and Other Protein Products Ground beef (85% or leaner), grass-fed beef, or beef trimmed of fat. Skinless chicken or turkey. Ground chicken or turkey. Pork trimmed of fat. All fish and seafood. Eggs. Dried beans, peas, or lentils. Unsalted nuts and seeds. Unsalted canned beans. Dairy Low-fat dairy products, such as skim or 1% milk, 2% or reduced-fat cheeses, low-fat ricotta or cottage cheese, or plain low-fat yogurt. Low-sodium or reduced-sodium cheeses. Fats and Oils Tub margarines without trans fats. Light or reduced-fat mayonnaise and salad dressings (reduced sodium). Avocado. Safflower, olive, or canola oils. Natural peanut or almond butter. Other Unsalted popcorn and pretzels. The items listed above may not be a complete list of recommended foods or beverages. Contact your dietitian for more options. WHAT FOODS ARE NOT RECOMMENDED? Grains White bread. White pasta. White rice. Refined cornbread. Bagels and croissants. Crackers that contain trans fat. Vegetables Creamed or fried vegetables. Vegetables in a cheese sauce. Regular canned vegetables. Regular canned tomato sauce and paste. Regular tomato and vegetable juices. Fruits Dried fruits. Canned fruit in light or heavy syrup. Fruit juice. Meat and Other Protein Products Fatty cuts of meat. Ribs, chicken wings, bacon, sausage, bologna, salami, chitterlings, fatback, hot dogs, bratwurst, and packaged luncheon meats. Salted nuts and seeds. Canned beans with salt. Dairy Whole or 2% milk, cream, half-and-half, and cream cheese. Whole-fat or sweetened yogurt. Full-fat   cheeses or blue cheese. Nondairy creamers and whipped toppings. Processed cheese, cheese spreads, or cheese  curds. Condiments Onion and garlic salt, seasoned salt, table salt, and sea salt. Canned and packaged gravies. Worcestershire sauce. Tartar sauce. Barbecue sauce. Teriyaki sauce. Soy sauce, including reduced sodium. Steak sauce. Fish sauce. Oyster sauce. Cocktail sauce. Horseradish. Ketchup and mustard. Meat flavorings and tenderizers. Bouillon cubes. Hot sauce. Tabasco sauce. Marinades. Taco seasonings. Relishes. Fats and Oils Butter, stick margarine, lard, shortening, ghee, and bacon fat. Coconut, palm kernel, or palm oils. Regular salad dressings. Other Pickles and olives. Salted popcorn and pretzels. The items listed above may not be a complete list of foods and beverages to avoid. Contact your dietitian for more information. WHERE CAN I FIND MORE INFORMATION? National Heart, Lung, and [employer] Institute: www.nhlbi.nih.gov/health/health-topics/topics/dash/ Document Released: 08/25/2011 Document Revised: 01/20/2014 Document Reviewed: 07/10/2013 ExitCare Patient Information 2015 [employer]. This information is not intended to replace advice given to you by your health care provider. Make sure you discuss any questions you have with your health care provider. Hypertension Hypertension, commonly called high blood pressure, is when the force of blood pumping through your arteries is too strong. Your arteries are the blood vessels that carry blood from your heart throughout your body. A blood pressure reading consists of a higher number over a lower number, such as 110/72. The higher number (systolic) is the pressure inside your arteries when your heart pumps. The lower number (diastolic) is the pressure inside your arteries when your heart relaxes. Ideally you want your blood pressure below 120/80. Hypertension forces your heart to work harder to pump blood. Your arteries may become narrow or stiff. Having hypertension puts you at risk for heart disease, stroke, and other problems.  RISK  FACTORS Some risk factors for high blood pressure are controllable. Others are not.  Risk factors you cannot control include:   Race. You may be at higher risk if you are African American.  Age. Risk increases with age.  Gender. Men are at higher risk than women before age 45 years. After age 65, women are at higher risk than men. Risk factors you can control include:  Not getting enough exercise or physical activity.  Being overweight.  Getting too much fat, sugar, calories, or salt in your diet.  Drinking too much alcohol. SIGNS AND SYMPTOMS Hypertension does not usually cause signs or symptoms. Extremely high blood pressure (hypertensive crisis) may cause headache, anxiety, shortness of breath, and nosebleed. DIAGNOSIS  To check if you have hypertension, your health care provider will measure your blood pressure while you are seated, with your arm held at the level of your heart. It should be measured at least twice using the same arm. Certain conditions can cause a difference in blood pressure between your right and left arms. A blood pressure reading that is higher than normal on one occasion does not mean that you need treatment. If one blood pressure reading is high, ask your health care provider about having it checked again. TREATMENT  Treating high blood pressure includes making lifestyle changes and possibly taking medicine. Living a healthy lifestyle can help lower high blood pressure. You may need to change some of your habits. Lifestyle changes may include:  Following the DASH diet. This diet is high in fruits, vegetables, and whole grains. It is low in salt, red meat, and added sugars.  Getting at least 2 hours of brisk physical activity every week.  Losing weight if necessary.  Not smoking.  Limiting   alcoholic beverages.  Learning ways to reduce stress. If lifestyle changes are not enough to get your blood pressure under control, your health care provider may  prescribe medicine. You may need to take more than one. Work closely with your health care provider to understand the risks and benefits. HOME CARE INSTRUCTIONS  Have your blood pressure rechecked as directed by your health care provider.   Take medicines only as directed by your health care provider. Follow the directions carefully. Blood pressure medicines must be taken as prescribed. The medicine does not work as well when you skip doses. Skipping doses also puts you at risk for problems.   Do not smoke.   Monitor your blood pressure at home as directed by your health care provider. SEEK MEDICAL CARE IF:   You think you are having a reaction to medicines taken.  You have recurrent headaches or feel dizzy.  You have swelling in your ankles.  You have trouble with your vision. SEEK IMMEDIATE MEDICAL CARE IF:  You develop a severe headache or confusion.  You have unusual weakness, numbness, or feel faint.  You have severe chest or abdominal pain.  You vomit repeatedly.  You have trouble breathing. MAKE SURE YOU:   Understand these instructions.  Will watch your condition.  Will get help right away if you are not doing well or get worse. Document Released: 09/05/2005 Document Revised: 01/20/2014 Document Reviewed: 06/28/2013 ExitCare Patient Information 2015 [employer]. This information is not intended to replace advice given to you by your health care provider. Make sure you discuss any questions you have with your health care provider.  

## 2014-07-28 ENCOUNTER — Telehealth (INDEPENDENT_AMBULATORY_CARE_PROVIDER_SITE_OTHER): Payer: Self-pay | Admitting: *Deleted

## 2014-07-28 ENCOUNTER — Encounter (INDEPENDENT_AMBULATORY_CARE_PROVIDER_SITE_OTHER): Payer: Self-pay | Admitting: *Deleted

## 2014-07-28 NOTE — Telephone Encounter (Signed)
Patient left message that she is cancelling appt on 11/19 and if any questions you can call her at [phone].  Make sure to send to PCP that she cancelled

## 2014-07-30 NOTE — Telephone Encounter (Signed)
Apt has been cancelled

## 2014-08-07 ENCOUNTER — Ambulatory Visit: Payer: Medicaid Other | Admitting: Internal Medicine

## 2014-08-07 ENCOUNTER — Ambulatory Visit (INDEPENDENT_AMBULATORY_CARE_PROVIDER_SITE_OTHER): Payer: Medicaid Other | Admitting: Internal Medicine

## 2014-08-08 ENCOUNTER — Ambulatory Visit
Admission: RE | Admit: 2014-08-08 | Discharge: 2014-08-08 | Disposition: A | Discharge status: Home / Self Care | Payer: Medicaid Other | Source: Ambulatory Visit | Attending: Family Medicine | Admitting: Family Medicine

## 2014-08-08 ENCOUNTER — Encounter (INDEPENDENT_AMBULATORY_CARE_PROVIDER_SITE_OTHER): Payer: Self-pay

## 2014-08-08 DIAGNOSIS — Z1231 Encounter for screening mammogram for malignant neoplasm of breast: Secondary | ICD-10-CM

## 2014-08-25 ENCOUNTER — Ambulatory Visit: Payer: Medicaid Other | Admitting: Internal Medicine

## 2014-09-01 ENCOUNTER — Other Ambulatory Visit: Payer: Self-pay | Admitting: Gastroenterology

## 2014-09-04 ENCOUNTER — Ambulatory Visit: Payer: Medicaid Other | Admitting: Internal Medicine

## 2014-09-22 ENCOUNTER — Encounter (HOSPITAL_COMMUNITY): Payer: Self-pay | Admitting: *Deleted

## 2014-10-02 ENCOUNTER — Ambulatory Visit (INDEPENDENT_AMBULATORY_CARE_PROVIDER_SITE_OTHER): Payer: Medicaid Other | Admitting: Internal Medicine

## 2014-10-02 VITALS — BP 158/92 | HR 96 | Temp 98.1°F | Resp 16 | Ht 62.0 in | Wt 180.0 lb

## 2014-10-02 DIAGNOSIS — E781 Pure hyperglyceridemia: Secondary | ICD-10-CM

## 2014-10-02 DIAGNOSIS — R7309 Other abnormal glucose: Secondary | ICD-10-CM

## 2014-10-02 DIAGNOSIS — I10 Essential (primary) hypertension: Secondary | ICD-10-CM

## 2014-10-02 DIAGNOSIS — R7303 Prediabetes: Secondary | ICD-10-CM

## 2014-10-03 ENCOUNTER — Encounter (HOSPITAL_COMMUNITY)
Admission: RE | Disposition: A | Discharge status: Home / Self Care | Payer: Self-pay | Source: Ambulatory Visit | Attending: Gastroenterology

## 2014-10-03 ENCOUNTER — Ambulatory Visit (HOSPITAL_COMMUNITY): Payer: Medicaid Other | Admitting: Certified Registered"

## 2014-10-03 ENCOUNTER — Ambulatory Visit (HOSPITAL_COMMUNITY)
Admission: RE | Admit: 2014-10-03 | Discharge: 2014-10-03 | Disposition: A | Discharge status: Home / Self Care | Payer: Medicaid Other | Source: Ambulatory Visit | Attending: Gastroenterology | Admitting: Gastroenterology

## 2014-10-03 ENCOUNTER — Encounter (HOSPITAL_COMMUNITY): Payer: Self-pay | Admitting: *Deleted

## 2014-10-03 DIAGNOSIS — R1013 Epigastric pain: Secondary | ICD-10-CM | POA: Diagnosis present

## 2014-10-03 DIAGNOSIS — Z79899 Other long term (current) drug therapy: Secondary | ICD-10-CM | POA: Diagnosis not present

## 2014-10-03 DIAGNOSIS — I10 Essential (primary) hypertension: Secondary | ICD-10-CM | POA: Diagnosis not present

## 2014-10-03 DIAGNOSIS — F17210 Nicotine dependence, cigarettes, uncomplicated: Secondary | ICD-10-CM | POA: Diagnosis not present

## 2014-10-03 DIAGNOSIS — K219 Gastro-esophageal reflux disease without esophagitis: Secondary | ICD-10-CM | POA: Diagnosis not present

## 2014-10-03 DIAGNOSIS — K862 Cyst of pancreas: Secondary | ICD-10-CM | POA: Insufficient documentation

## 2014-10-03 DIAGNOSIS — J45909 Unspecified asthma, uncomplicated: Secondary | ICD-10-CM | POA: Insufficient documentation

## 2014-10-03 DIAGNOSIS — M179 Osteoarthritis of knee, unspecified: Secondary | ICD-10-CM | POA: Insufficient documentation

## 2014-10-03 HISTORY — PX: EUS: SHX5427

## 2014-10-03 SURGERY — UPPER ENDOSCOPIC ULTRASOUND (EUS) LINEAR
Anesthesia: Monitor Anesthesia Care

## 2014-10-03 MED ORDER — CIPROFLOXACIN HCL 500 MG PO TABS: 500.0000 mg | ORAL_TABLET | Freq: Two times a day (BID) | ORAL | Status: DC

## 2014-10-03 MED ORDER — PROPOFOL 10 MG/ML IV BOLUS
INTRAVENOUS | Status: DC | PRN
  Administered 2014-10-03: 50 mg via INTRAVENOUS
  Administered 2014-10-03: 30 mg via INTRAVENOUS
  Administered 2014-10-03: 70 mg via INTRAVENOUS
  Administered 2014-10-03: 30 mg via INTRAVENOUS

## 2014-10-03 MED ORDER — FENTANYL CITRATE 0.05 MG/ML IJ SOLN: 25.0000 ug | INTRAMUSCULAR | Status: DC | PRN

## 2014-10-03 MED ORDER — LIDOCAINE HCL (CARDIAC) 20 MG/ML IV SOLN
INTRAVENOUS | Status: AC
  Filled 2014-10-03: qty 5

## 2014-10-03 MED ORDER — SODIUM CHLORIDE 0.9 % IV SOLN: INTRAVENOUS | Status: DC

## 2014-10-03 MED ORDER — PROPOFOL 10 MG/ML IV BOLUS
INTRAVENOUS | Status: AC
  Filled 2014-10-03: qty 20

## 2014-10-03 MED ORDER — PROPOFOL INFUSION 10 MG/ML OPTIME
INTRAVENOUS | Status: DC | PRN
  Administered 2014-10-03: 140 ug/kg/min via INTRAVENOUS

## 2014-10-03 MED ORDER — LACTATED RINGERS IV SOLN
INTRAVENOUS | Status: DC
  Administered 2014-10-03: 1000 mL via INTRAVENOUS

## 2014-10-03 MED ORDER — LIDOCAINE HCL (PF) 2 % IJ SOLN
INTRAMUSCULAR | Status: DC | PRN
  Administered 2014-10-03: 20 mg via INTRADERMAL

## 2014-10-03 NOTE — Anesthesia Postprocedure Evaluation (Signed)
  Anesthesia Post-op Note  Patient: Melinda [name]  Procedure(s) Performed: Procedure(s): UPPER ENDOSCOPIC ULTRASOUND (EUS) LINEAR (N/A) Patient is awake and responsive. Pain and nausea are reasonably well controlled. Vital signs are stable and clinically acceptable. Oxygen saturation is clinically acceptable. There are no apparent anesthetic complications at this time. Patient is ready for discharge.

## 2014-10-03 NOTE — Op Note (Signed)
[facility] [place], [address]   UPPER ENDOSCOPIC ULTRASOUND PROCEDURE REPORT     EXAM DATE: 10/03/2014  PATIENT NAME:          Melinda [name], Melinda [name]          MR#:        [id number]  BIRTHDATE:       1960/10/15     VISIT #:     [phone] ATTENDING:     Carol Ada, MD     STATUS:     outpatient ASSISTANT:      Cherylynn Ridges and Mariana Single REFERRING MD: ASA CLASS:        Class III  INDICATIONS:  The patient is a 55 yr old female here for a lower endoscopic ultrasound due to pancreatic cyst PROCEDURE PERFORMED:     Upper Endoscopic Ultrasound with FNA   MEDICATIONS:     Monitored anesthesia care  CONSENT: The patient understands the risks and benefits of the procedure and understands that these risks include, but are not limited to: sedation, allergic reaction, infection, perforation and/or bleeding. Alternative means of evaluation and treatment include, among others: physical exam, x-rays, and/or surgical intervention. The patient elects to proceed with this endoscopic procedure.  DESCRIPTION OF PROCEDURE: During intra-op preparation period all mechanical & medical equipment was checked for proper function. Hand hygiene and appropriate measures for infection prevention was taken. After the risks, benefits and alternatives of the procedure were thoroughly explained, Informed consent was verified, confirmed and timeout was successfully executed by the treatment team. The patient was then placed in the left, lateral, decubitus position and IV sedation was administered. Throughout the procedure, the patients blood pressure, pulse and oxygen saturations were monitored continuously. Under direct visualization, the    was introduced through the mouth and advanced to the bulb of duodenum. Water was used as necessary to provide an acoustic interface. The pulse, BP, and O2 saturation were monitored and documented by the physician and nursing  staff throughout the procedure. Upon completion of the imaging, water was removed and the patient was then discharged to recovery in stable condition with the appropriate post procedure care.   PANCREAS: In the body of the pancreas a 1.3 cm cyst was identified. The parenchyma of the pancreas demonstrated hyperechoic stranding. the PD appeared to be normal caliber.  In the head of the pancreas a large 3 cm cyst with dependent debris was again identified.  This cyst occupied the majority of the pancreatic head region and it abutted the SMV/PC region.  The CBD was measured to be 5.8 mm in the proximal duct.  Using a 22 gauge FNA needle ad single pass was performed and approximately 14.5 ml of cloudy thick fluid was aspirated.  No other abnormalities identified.    ADVERSE EVENTS:     There were no complications IMPRESSIONS:     1) 3 cm head of the pancreas cyst with debris s/p FNA drainage. 2) 1.3 cm cyst in the pancreatic body. 3) Hyperechoic stranding in the body of the pancreas.  RECOMMENDATIONS:     1) Check Amylase and CEA of the cyst. 2) Follow up in one month. 3) Ciprofloxacin 500 mg BID x 3 days.  ___________________________________ Carol Ada, MD eSigned:  Carol Ada, MD 10/03/2014 9:51 AM   cc:      PATIENT NAME:  Melinda [name], Melinda [name] MR#: [id number]

## 2014-10-03 NOTE — Transfer of Care (Signed)
Immediate Anesthesia Transfer of Care Note  Patient: Melinda [name]  Procedure(s) Performed: Procedure(s) (LRB): UPPER ENDOSCOPIC ULTRASOUND (EUS) LINEAR (N/A)  Patient Location: PACU  Anesthesia Type: MAC  Level of Consciousness: sedated, patient cooperative and responds to stimulation  Airway & Oxygen Therapy: Patient Spontanous Breathing and Patient connected to face mask oxgen  Post-op Assessment: Report given to PACU RN and Post -op Vital signs reviewed and stable  Post vital signs: Reviewed and stable  Complications: No apparent anesthesia complications

## 2014-10-03 NOTE — H&P (Signed)
   Rayssa Jobson HPI: 54 year old female with complaint so epigastric pain.  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  The largest cyst was aspirated by Dr. Paulita Fujita in the past, but she did not have significant improvement in her symptoms.  A repeat EUS was attempted in 06/2014, but she was too restless with the procedure and the procedure was abandoned.  Over the interval time period she denies any significant change in her pain.    Past Medical History  Diagnosis Date  . Pancreatitis 2 or 3 times a week  . Asthma   . Hypertension   . GERD (gastroesophageal reflux disease)   . Headache     sinus now, hx of migraines last 2 or 3 years ago  . Arthritis     knees  . History of blood transfusion 2012    after gallbladder surgery    Past Surgical History  Procedure Laterality Date  . Cesarean section      x 1  . Eus N/A 11/14/2012    Procedure: FULL UPPER ENDOSCOPIC ULTRASOUND (EUS) RADIAL;  Surgeon: Arta Silence, MD;  Location: [facility];  Service: Endoscopy;  Laterality: N/A;  extended case to 90 min per Dr Paulita Fujita for definate FNA/JB  . Cholecystectomy  2012  . Eus N/A 07/18/2014    Procedure: UPPER ENDOSCOPIC ULTRASOUND (EUS) LINEAR;  Surgeon: Beryle Beams, MD;  Location: [facility];  Service: Endoscopy;  Laterality: N/A;    History reviewed. No pertinent family history.  Social History:  reports that she has been smoking Cigarettes.  She has a 1.25 pack-year smoking history. She has never used smokeless tobacco. She reports that she drinks alcohol. She reports that she does not use illicit drugs.  Allergies:  Allergies  Allergen Reactions  . Codeine Itching and Nausea Only    Medications:  Scheduled:  Continuous: . sodium chloride    . lactated ringers 1,000 mL (10/03/14 0842)    No results found for this or any previous visit (from the past 24 hour(s)).   No results found.  ROS:  As stated above in the HPI  otherwise negative.  Blood pressure 149/89, pulse 92, temperature 98 F (36.7 C), temperature source Oral, resp. rate 14, height 5\' 2"  (1.575 m), weight 81.647 kg (180 lb), last menstrual period 09/23/2011, SpO2 97 %.    PE: Gen: NAD, Alert and Oriented HEENT:  [place]/AT, EOMI Neck: Supple, no LAD Lungs: CTA Bilaterally CV: RRR without M/G/R ABM: Soft, NTND, +BS Ext: No C/C/E  Assessment/Plan: 1) Pancreatic cysts. 2) History of pancreatitis.   I will repeat the EUS today and possibly aspirate the cyst again.  Plan: 1) EUS now.  Jamill Wetmore D 10/03/2014, 9:17 AM

## 2014-10-03 NOTE — Anesthesia Preprocedure Evaluation (Addendum)
Anesthesia Evaluation  Patient identified by MRN, date of birth, ID band Patient awake    Reviewed: Allergy & Precautions, H&P , NPO status , Patient's Chart, lab work & pertinent test results  History of Anesthesia Complications Negative for: history of anesthetic complications  Airway Mallampati: II  TM Distance: >3 FB Neck ROM: Full    Dental  (+) Partial Upper, Dental Advisory Given   Pulmonary asthma , Current Smoker,  breath sounds clear to auscultation  Pulmonary exam normal       Cardiovascular hypertension, Pt. on medications Rhythm:Regular Rate:Normal     Neuro/Psych  Headaches, negative psych ROS   GI/Hepatic Neg liver ROS, GERD-  Medicated and Controlled,  Endo/Other  negative endocrine ROS  Renal/GU negative Renal ROS  negative genitourinary   Musculoskeletal  (+) Arthritis -,   Abdominal   Peds negative pediatric ROS (+)  Hematology negative hematology ROS (+)   Anesthesia Other Findings Combative nature and requirment of large doses of Propofol noted; Discussed with CRNA  Reproductive/Obstetrics negative OB ROS                            Anesthesia Physical  Anesthesia Plan  ASA: II  Anesthesia Plan: MAC   Post-op Pain Management:    Induction: Intravenous  Airway Management Planned: Nasal Cannula  Additional Equipment:   Intra-op Plan:   Post-operative Plan: Extubation in OR  Informed Consent: I have reviewed the patients History and Physical, chart, labs and discussed the procedure including the risks, benefits and alternatives for the proposed anesthesia with the patient or authorized representative who has indicated his/her understanding and acceptance.   Dental advisory given  Plan Discussed with: CRNA  Anesthesia Plan Comments: (Will use inhaler pre-op; pt thinks combativeness was due to perceived dyspnea)       Anesthesia Quick Evaluation

## 2014-10-06 ENCOUNTER — Encounter (HOSPITAL_COMMUNITY): Payer: Self-pay | Admitting: Gastroenterology

## 2014-10-06 LAB — MISCELLANEOUS TEST

## 2014-10-13 DIAGNOSIS — R7303 Prediabetes: Secondary | ICD-10-CM | POA: Insufficient documentation

## 2014-10-13 DIAGNOSIS — E785 Hyperlipidemia, unspecified: Secondary | ICD-10-CM | POA: Insufficient documentation

## 2014-10-13 DIAGNOSIS — E781 Pure hyperglyceridemia: Secondary | ICD-10-CM | POA: Insufficient documentation

## 2014-10-13 NOTE — Progress Notes (Signed)
Patient ID: Melinda [name], female   DOB: 1961-01-20, 54 y.o.   MRN: [id number]  HPI 54 y.o. female  presents for 3 month follow up with hypertension, hyperlipidemia, prediabetes and vitamin D. Her blood pressure has not been controlled at home, today their BP is BP: (!) 158/92 mmHg She does not workout. She denies chest pain, shortness of breath, dizziness.  She is not on cholesterol medication. Her cholesterol is not at goal. The cholesterol last visit was:   Lab Results  Component Value Date   CHOL 178 06/10/2013   HDL 50 06/10/2013   LDLCALC 98 06/10/2013   TRIG 150* 06/10/2013   CHOLHDL 3.6 06/10/2013   She has not been working on diet and exercise for prediabetes, and denies polydipsia, polyuria and visual disturbances. Last A1C in the office was:  Lab Results  Component Value Date   HGBA1C 5.8* 07/02/2014   Patient is not on Vitamin D supplement.   No results found for: VD25OH     Current Medications:  Current Outpatient Prescriptions on File Prior to Visit  Medication Sig Dispense Refill  . albuterol (PROVENTIL HFA;VENTOLIN HFA) 108 (90 BASE) MCG/ACT inhaler Inhale 2 puffs into the lungs every 6 (six) hours as needed for wheezing or shortness of breath. 1 Inhaler 0  . Fluticasone-Salmeterol (ADVAIR) 500-50 MCG/DOSE AEPB Inhale 1 puff into the lungs 2 (two) times daily.    . hydrochlorothiazide (HYDRODIURIL) 25 MG tablet Take 1 tablet (25 mg total) by mouth daily with lunch. 30 tablet 3  . ibuprofen (ADVIL,MOTRIN) 800 MG tablet Take 800 mg by mouth every 8 (eight) hours as needed for mild pain or moderate pain.    . promethazine (PHENERGAN) 12.5 MG tablet Take 1 tablet (12.5 mg total) by mouth every 8 (eight) hours as needed for nausea or vomiting. 30 tablet 0  . ciprofloxacin (CIPRO) 500 MG tablet Take 1 tablet (500 mg total) by mouth 2 (two) times daily. 6 tablet 0  . ciprofloxacin (CIPRO) 500 MG tablet Take 1 tablet (500 mg total) by mouth 2 (two) times daily. 6 tablet 0  .  ciprofloxacin (CIPRO) 500 MG tablet Take 1 tablet (500 mg total) by mouth 2 (two) times daily. 6 tablet 0   No current facility-administered medications on file prior to visit.   Medical History:  Past Medical History  Diagnosis Date  . Pancreatitis 2 or 3 times a week  . Asthma   . Hypertension   . GERD (gastroesophageal reflux disease)   . Headache     sinus now, hx of migraines last 2 or 3 years ago  . Arthritis     knees  . History of blood transfusion 2012    after gallbladder surgery   Allergies:  Allergies  Allergen Reactions  . Codeine Itching and Nausea Only     Review of Systems:  Review of Systems  Constitutional: Negative.   HENT: Negative.   Eyes: Negative.   Respiratory: Negative.   Cardiovascular: Negative.   Gastrointestinal: Positive for abdominal pain (epigastric pain).  Genitourinary: Negative.   Musculoskeletal: Negative.   Skin: Negative.   Neurological: Negative.   Endo/Heme/Allergies: Negative.   Psychiatric/Behavioral: Negative.      Family history- Review and unchanged Social history- Review and unchanged Physical Exam: BP 158/92 mmHg  Pulse 96  Temp(Src) 98.1 F (36.7 C) (Oral)  Resp 16  Ht 5\' 2"  (1.575 m)  Wt 180 lb (81.647 kg)  BMI 32.91 kg/m2  LMP 09/23/2011 Wt Readings from Last 3  Encounters:  10/03/14 180 lb (81.647 kg)  10/02/14 180 lb (81.647 kg)  07/21/14 178 lb (80.74 kg)   General Appearance: Well nourished, in no apparent distress. Eyes: PERRLA, EOMs, conjunctiva no swelling or erythema Sinuses: No Frontal/maxillary tenderness ENT/Mouth: Ext aud canals clear, TMs without erythema, bulging. No erythema, swelling, or exudate on post pharynx.  Tonsils not swollen or erythematous. Hearing normal.  Neck: Supple, thyroid normal.  Respiratory: Respiratory effort normal, BS equal bilaterally without rales, rhonchi, wheezing or stridor.  Cardio: RRR with no MRGs. Brisk peripheral pulses without edema.  Abdomen: Soft, + BS.   Non tender, no guarding, rebound, hernias, masses. Lymphatics: Non tender without lymphadenopathy.  Musculoskeletal: Full ROM, 5/5 strength, normal gait.  Skin: Warm, dry without rashes, lesions, ecchymosis.  Neuro: Cranial nerves intact. Normal muscle tone, no cerebellar symptoms. Sensation intact.  Psych: Awake and oriented X 3, normal affect, Insight and Judgment appropriate.   Assessment and Plan:   1. Uncontrolled hypertension - Continue medication, monitor blood pressure at home. Continue DASH diet.  Reminder to go to the ER if any CP, SOB, nausea, dizziness, severe HA, changes vision/speech, left arm numbness and tingling, and jaw pain.  2. High triglycerides - This may be associated with disorder of pancrease. Pt has scheduled U/S and FNA of pancreas scheduled for tomorrow.  - Continue diet and exercise. Check cholesterol.   3. Pre-diabetes - Continue diet and exercise. Check A1C     Continue diet and meds as discussed. Further disposition pending results of labs.  MATTHEWS,MICHELLE A., MD 12:32 PM [facility]

## 2014-10-23 ENCOUNTER — Ambulatory Visit (INDEPENDENT_AMBULATORY_CARE_PROVIDER_SITE_OTHER): Payer: Medicaid Other | Admitting: Internal Medicine

## 2014-10-23 ENCOUNTER — Encounter: Payer: Self-pay | Admitting: Internal Medicine

## 2014-10-23 VITALS — BP 152/102 | HR 102 | Temp 98.4°F | Resp 16 | Ht 62.0 in | Wt 179.0 lb

## 2014-10-23 DIAGNOSIS — R229 Localized swelling, mass and lump, unspecified: Secondary | ICD-10-CM

## 2014-10-23 DIAGNOSIS — R911 Solitary pulmonary nodule: Secondary | ICD-10-CM

## 2014-10-23 DIAGNOSIS — J45909 Unspecified asthma, uncomplicated: Secondary | ICD-10-CM | POA: Insufficient documentation

## 2014-10-23 DIAGNOSIS — J4540 Moderate persistent asthma, uncomplicated: Secondary | ICD-10-CM

## 2014-10-23 DIAGNOSIS — I10 Essential (primary) hypertension: Secondary | ICD-10-CM

## 2014-10-23 MED ORDER — FLUTICASONE-SALMETEROL 500-50 MCG/DOSE IN AEPB
1.0000 | INHALATION_SPRAY | Freq: Two times a day (BID) | RESPIRATORY_TRACT | Status: DC
Start: 2014-10-23 — End: 2015-11-29

## 2014-10-23 MED ORDER — AMLODIPINE BESYLATE 5 MG PO TABS: 5.0000 mg | ORAL_TABLET | Freq: Every day | ORAL | Status: DC

## 2014-10-23 NOTE — Progress Notes (Signed)
Patient ID: Melinda [name], female   DOB: 07/10/1961, 54 y.o.   MRN: [id number]   Melinda [name], is a 54 y.o. female  [id number]  [id number]  DOB - Jul 13, 1961  CC:  Chief Complaint  Patient presents with  . Cyst    on back   . Hypertension  . Lung Lesion       HPI: Melinda [name] is a 54 y.o. female here today to address subcutaneous nodule on her thoracic spinal area. She reports that she has had the nodule there for about 1 year, and it seems to be getting larger. She also reports hat it is now causing her some pain when pressure applied. She denies any trauma or any associated symptoms. She denies any UE or LE weakness.   It was also noted on her CT scan ordered by Dr. Benson [name] in October that she has a lung nodule of approximately 11 mm. Pt is a smoker and has some occasional SOB which she attributes to asthma. She denies any progressive cough, fevers, night sweats or weight loss.   Pt is also noted to have an elevated BP in both UE's.   Patient has No headache, No chest pain, No abdominal pain - No Nausea, No new weakness tingling or numbness.   Allergies  Allergen Reactions  . Codeine Itching and Nausea Only   Past Medical History  Diagnosis Date  . Pancreatitis 2 or 3 times a week  . Asthma   . Hypertension   . GERD (gastroesophageal reflux disease)   . Headache     sinus now, hx of migraines last 2 or 3 years ago  . Arthritis     knees  . History of blood transfusion 2012    after gallbladder surgery   Current Outpatient Prescriptions on File Prior to Visit  Medication Sig Dispense Refill  . albuterol (PROVENTIL HFA;VENTOLIN HFA) 108 (90 BASE) MCG/ACT inhaler Inhale 2 puffs into the lungs every 6 (six) hours as needed for wheezing or shortness of breath. 1 Inhaler 0  . hydrochlorothiazide (HYDRODIURIL) 25 MG tablet Take 1 tablet (25 mg total) by mouth daily with lunch. 30 tablet 3  . ibuprofen (ADVIL,MOTRIN) 800 MG tablet Take 800 mg by mouth every 8 (eight) hours as  needed for mild pain or moderate pain.    . promethazine (PHENERGAN) 12.5 MG tablet Take 1 tablet (12.5 mg total) by mouth every 8 (eight) hours as needed for nausea or vomiting. 30 tablet 0  . ciprofloxacin (CIPRO) 500 MG tablet Take 1 tablet (500 mg total) by mouth 2 (two) times daily. (Patient not taking: Reported on 10/23/2014) 6 tablet 0  . ciprofloxacin (CIPRO) 500 MG tablet Take 1 tablet (500 mg total) by mouth 2 (two) times daily. (Patient not taking: Reported on 10/23/2014) 6 tablet 0  . ciprofloxacin (CIPRO) 500 MG tablet Take 1 tablet (500 mg total) by mouth 2 (two) times daily. (Patient not taking: Reported on 10/23/2014) 6 tablet 0   No current facility-administered medications on file prior to visit.   No family history on file. History   Social History  . Marital Status: Married    Spouse Name: N/A    Number of Children: N/A  . Years of Education: N/A   Occupational History  . Not on file.   Social History Main Topics  . Smoking status: Current Some Day Smoker -- 0.25 packs/day for 5 years    Types: Cigarettes  . Smokeless tobacco: Never Used  . Alcohol Use: Yes  Comment: occasional  . Drug Use: No  . Sexual Activity: Not on file   Other Topics Concern  . Not on file   Social History Narrative    Review of Systems: Constitutional: Negative for fever, chills, diaphoresis, activity change, appetite change and fatigue. HENT: Negative for ear pain, nosebleeds, congestion, facial swelling, rhinorrhea, neck pain, neck stiffness and ear discharge.  Eyes: Negative for pain, discharge, redness, itching and visual disturbance. Respiratory: Negative for cough, choking, chest tightness, shortness of breath, wheezing and stridor.  Cardiovascular: Negative for chest pain, palpitations and leg swelling. Gastrointestinal: Negative for abdominal distention. Genitourinary: Negative for dysuria, urgency, frequency, hematuria, flank pain, decreased urine volume, difficulty urinating  and dyspareunia.  Musculoskeletal: Negative for back pain, joint swelling, arthralgia and gait problem. Neurological: Negative for dizziness, tremors, seizures, syncope, facial asymmetry, speech difficulty, weakness, light-headedness, numbness and headaches.  Hematological: Negative for adenopathy. Does not bruise/bleed easily. Psychiatric/Behavioral: Negative for hallucinations, behavioral problems, confusion, dysphoric mood, decreased concentration and agitation.     Objective:     Filed Vitals:   10/23/14 1431  BP: 152/102  Pulse:   Temp:   Resp:     Physical Exam: Constitutional: Patient appears well-developed and well-nourished. No distress. HENT: Normocephalic, atraumatic, External right and left ear normal. Oropharynx is clear and moist.  Eyes: Conjunctivae and EOM are normal. PERRLA, no scleral icterus. Neck: Normal ROM. Neck supple. No JVD. No tracheal deviation. No thyromegaly. CVS: RRR, S1/S2 +, no murmurs, no gallops, no carotid bruit.  Pulmonary: Effort and breath sounds normal, no stridor, rhonchi, wheezes, rales.  Musculoskeletal: Normal range of motion. No edema and no tenderness.  Lymphadenopathy: No lymphadenopathy noted, cervical, inguinal or axillary Neuro: Alert. Normal reflexes, muscle tone coordination. No cranial nerve deficit. Skin: Skin is warm and dry. No rash noted. Not diaphoretic. No erythema. No pallor.   Lab Results  Component Value Date   WBC 8.3 07/02/2014   HGB 15.2* 07/02/2014   HCT 44.2 07/02/2014   MCV 98.7 07/02/2014   PLT 334 07/02/2014   Lab Results  Component Value Date   CREATININE 0.86 07/02/2014   BUN 16 07/02/2014   NA 140 07/02/2014   K 4.2 07/02/2014   CL 104 07/02/2014   CO2 27 07/02/2014    Lab Results  Component Value Date   HGBA1C 5.8* 07/02/2014   Lipid Panel     Component Value Date/Time   CHOL 178 06/10/2013 1656   TRIG 150* 06/10/2013 1656   HDL 50 06/10/2013 1656   CHOLHDL 3.6 06/10/2013 1656   VLDL  30 06/10/2013 1656   LDLCALC 98 06/10/2013 1656       Assessment and plan:   1. Uncontrolled hypertension - BP markedly elevated.Will add Norvasc to HCTZ 25 mg. Recheck BP in 5 weeks. - amLODipine (NORVASC) 5 MG tablet; Take 1 tablet (5 mg total) by mouth daily.  Dispense: 90 tablet; Refill: 3  2. Solitary lung nodule - Pt has an incompletly imaged solitary lung nodule of 11 mm in size noted on CT abdomen and pelvis. - CT Chest W Contrast; Future - Pulmonary Function Test; Future  3. Subcutaneous mass on thoracic spinal region - Will obtain CT of thoracic area with contrast. - CT Chest W Contrast; Future  4. Asthma, chronic, moderate persistent, uncomplicated - Pt reports a history of Asthma and has been on Advair and Albuterol for several years. She is having some SOB and in light of the pulmonary nodule will send for PFT's to evaluate  lung function confirm diagnosis especially since patient is a smoker. - Pulmonary Function Test; Future   Return in about 5 weeks (around 11/27/2014) for HTN, Annual Physical, lung nodule, subcutaneous nodule.  The patient was given clear instructions to go to ER or return to medical center if symptoms don't improve, worsen or new problems develop. The patient verbalized understanding. The patient was told to call to get lab results if they haven't heard anything in the next week.     This note has been created with Surveyor, quantity. Any transcriptional errors are unintentional.    Ysabela Keisler A., MD [place] [place]   10/23/2014, 2:40 PM

## 2014-10-28 ENCOUNTER — Ambulatory Visit (HOSPITAL_COMMUNITY)
Admission: RE | Admit: 2014-10-28 | Discharge: 2014-10-28 | Disposition: A | Discharge status: Home / Self Care | Payer: Medicaid Other | Source: Ambulatory Visit | Attending: Internal Medicine | Admitting: Internal Medicine

## 2014-10-28 ENCOUNTER — Encounter (HOSPITAL_COMMUNITY): Payer: Self-pay

## 2014-10-28 DIAGNOSIS — R911 Solitary pulmonary nodule: Secondary | ICD-10-CM | POA: Insufficient documentation

## 2014-10-28 DIAGNOSIS — R229 Localized swelling, mass and lump, unspecified: Secondary | ICD-10-CM

## 2014-10-28 HISTORY — DX: Type 2 diabetes mellitus without complications: E11.9

## 2014-10-28 MED ORDER — IOHEXOL 300 MG/ML  SOLN
80.0000 mL | Freq: Once | INTRAMUSCULAR | Status: AC | PRN
  Administered 2014-10-28: 80 mL via INTRAVENOUS

## 2014-11-21 ENCOUNTER — Other Ambulatory Visit: Payer: Medicaid Other

## 2014-11-27 ENCOUNTER — Ambulatory Visit: Payer: Medicaid Other | Admitting: Internal Medicine

## 2014-12-04 ENCOUNTER — Ambulatory Visit (INDEPENDENT_AMBULATORY_CARE_PROVIDER_SITE_OTHER): Payer: Medicaid Other | Admitting: Family Medicine

## 2014-12-04 VITALS — BP 168/94 | HR 100 | Temp 98.6°F | Resp 16 | Ht 62.0 in | Wt 175.0 lb

## 2014-12-04 DIAGNOSIS — I10 Essential (primary) hypertension: Secondary | ICD-10-CM | POA: Diagnosis not present

## 2014-12-04 DIAGNOSIS — L03113 Cellulitis of right upper limb: Secondary | ICD-10-CM

## 2014-12-04 DIAGNOSIS — F17200 Nicotine dependence, unspecified, uncomplicated: Secondary | ICD-10-CM

## 2014-12-04 MED ORDER — CEPHALEXIN 500 MG PO CAPS: 500.0000 mg | ORAL_CAPSULE | Freq: Two times a day (BID) | ORAL | Status: DC

## 2014-12-04 MED ORDER — AMLODIPINE BESYLATE 10 MG PO TABS: 5.0000 mg | ORAL_TABLET | Freq: Every day | ORAL | Status: DC

## 2014-12-04 NOTE — Progress Notes (Signed)
Subjective:    Patient ID: Melinda [name], female    DOB: June 03, 1961, 54 y.o.   MRN: [id number]  HPI [name], a 54 year old female present with complaint of right hand edema following a rose thorn prick to the dorsal aspect of hand greater than 1 week ago.The edema has been moderate. Current pain intensity is 4/10, unrelieved by Ibuprofen last taken 1 day ago.   Onset of symptoms was 1 week ago, clinical course has worsened since that time.   The swelling has been aggravated by increased activity and hand movements. Edema and pain are minimally relieved by ice packs.  Past Medical History  Diagnosis Date  . Pancreatitis 2 or 3 times a week  . Asthma   . Hypertension   . GERD (gastroesophageal reflux disease)   . Headache     sinus now, hx of migraines last 2 or 3 years ago  . Arthritis     knees  . History of blood transfusion 2012    after gallbladder surgery  . Diabetes mellitus without complication     diet controlled per pt.   History   Social History  . Marital Status: Married    Spouse Name: N/A  . Number of Children: N/A  . Years of Education: N/A   Social History Main Topics  . Smoking status: Current Some Day Smoker -- 0.25 packs/day for 5 years    Types: Cigarettes  . Smokeless tobacco: Never Used  . Alcohol Use: Yes     Comment: occasional  . Drug Use: No  . Sexual Activity: Not on file   Other Topics Concern  . Not on file   Social History Narrative   Allergies  Allergen Reactions  . Codeine Itching and Nausea Only    Review of Systems  Constitutional: Negative.   HENT: Negative.   Eyes: Negative.   Respiratory: Negative.   Cardiovascular: Negative for chest pain, palpitations and leg swelling.  Gastrointestinal: Positive for nausea and diarrhea (occasionally due to chronic pancreatitis).  Endocrine: Negative.   Genitourinary: Negative.   Musculoskeletal: Negative.   Skin: Negative.        Right hand edema and erythema   Allergic/Immunologic: Negative.   Neurological: Negative.   Hematological: Negative.   Psychiatric/Behavioral: Negative.        Objective:   Physical Exam  Constitutional: She is oriented to person, place, and time. She appears well-developed and well-nourished.  HENT:  Head: Normocephalic and atraumatic.  Right Ear: External ear normal.  Left Ear: External ear normal.  Nose: Nose normal.  Mouth/Throat: Oropharynx is clear and moist.  Eyes: Conjunctivae and EOM are normal. Pupils are equal, round, and reactive to light.  Neck: Normal range of motion. Neck supple.  Cardiovascular: Normal rate, regular rhythm, normal heart sounds and intact distal pulses.   Pulmonary/Chest: Effort normal and breath sounds normal.  Abdominal: Soft. Bowel sounds are normal.  Musculoskeletal:       Right hand: She exhibits decreased range of motion, tenderness and swelling. She exhibits no bony tenderness, normal capillary refill and no deformity. Normal sensation noted.  Increased erythema and edema to dorsal aspect of right hand  Neurological: She is alert and oriented to person, place, and time. She has normal reflexes.  Skin: Skin is warm and dry.  Psychiatric: She has a normal mood and affect. Her behavior is normal. Judgment and thought content normal.         BP 168/94 mmHg  Pulse 100  Temp(Src) 98.6 F (37 C) (Oral)  Resp 16  Ht 5\' 2"  (1.575 m)  Wt 175 lb (79.379 kg)  BMI 32.00 kg/m2  LMP 09/23/2011          Assessment & Plan:  .1. Cellulitis of right upper extremity Ms. Amerman was advised to elevate effected extremity at rest. Also continue to apply warm, moist compresses to area 4 times per day.  - cephALEXin (KEFLEX) 500 MG capsule; Take 1 capsule (500 mg total) by mouth 2 (two) times daily.  Dispense: 14 capsule; Refill: 0  2. Uncontrolled hypertension Blood pressure uncontrolled on current medication regimen. Reviewed previous labs, patient is without proteinuria and GFR is  wnl. Will increase Amlodipine from 5 to 10 mg day. Will have patient return for follow up of hypertension in 1 month. She will also follow up for a bp check in 1 week.   - amLODipine (NORVASC) 10 MG tablet; Take 0.5 tablets (5 mg total) by mouth daily.  Dispense: 30 tablet; Refill: 2  3. Tobacco dependence Smoking cessation instruction/counseling given:  counseled patient on the dangers of tobacco use, advised patient to stop smoking, and reviewed strategies to maximize success    RTC: 1 month for hypertension [name],[name], FNP

## 2014-12-04 NOTE — Patient Instructions (Signed)

## 2014-12-05 ENCOUNTER — Encounter: Payer: Self-pay | Admitting: Family Medicine

## 2014-12-11 ENCOUNTER — Ambulatory Visit: Payer: Medicaid Other

## 2014-12-11 VITALS — BP 144/90

## 2014-12-11 DIAGNOSIS — I10 Essential (primary) hypertension: Secondary | ICD-10-CM

## 2014-12-15 ENCOUNTER — Other Ambulatory Visit: Payer: Self-pay | Admitting: Family Medicine

## 2014-12-25 ENCOUNTER — Other Ambulatory Visit: Payer: Medicaid Other

## 2014-12-26 ENCOUNTER — Other Ambulatory Visit: Payer: Medicaid Other

## 2014-12-29 ENCOUNTER — Other Ambulatory Visit: Payer: Medicaid Other

## 2015-01-01 ENCOUNTER — Ambulatory Visit: Payer: Medicaid Other | Admitting: Family Medicine

## 2015-01-08 ENCOUNTER — Other Ambulatory Visit: Payer: Medicaid Other

## 2015-01-08 ENCOUNTER — Encounter: Payer: Medicaid Other | Admitting: Internal Medicine

## 2015-01-15 ENCOUNTER — Encounter: Payer: Medicaid Other | Admitting: Internal Medicine

## 2015-05-06 ENCOUNTER — Other Ambulatory Visit: Payer: Self-pay | Admitting: Family Medicine

## 2015-07-06 ENCOUNTER — Other Ambulatory Visit: Payer: Self-pay | Admitting: Internal Medicine

## 2015-07-22 ENCOUNTER — Other Ambulatory Visit: Payer: Self-pay | Admitting: Family Medicine

## 2015-09-08 ENCOUNTER — Other Ambulatory Visit: Payer: Self-pay | Admitting: Family Medicine

## 2015-10-16 ENCOUNTER — Other Ambulatory Visit: Payer: Self-pay | Admitting: Family Medicine

## 2015-11-26 IMAGING — CT CT ABD-PELV W/ CM
2 of 5 series · 16 of 46 positions shown, 18 images · IV contrast (READICAT/WATER & [ID] OMNI 300)
Comparison: CT 11/09/2012

CLINICAL DATA: Upper abdominal pain for 2 weeks. History of chronic
pancreatitis.

EXAM:
CT ABDOMEN AND PELVIS WITH CONTRAST
TECHNIQUE: Multidetector CT imaging of the abdomen and pelvis was performed
using the standard protocol following bolus administration of
intravenous contrast.
CONTRAST:  100mL OMNIPAQUE IOHEXOL 300 MG/ML  SOLN

[Series 2: abd/pelvis with · axial · 0.81mm/px · z∈[-370,-25]mm · 13 of 79 slices shown, 15 images]
[im 5/79  soft-tissue]
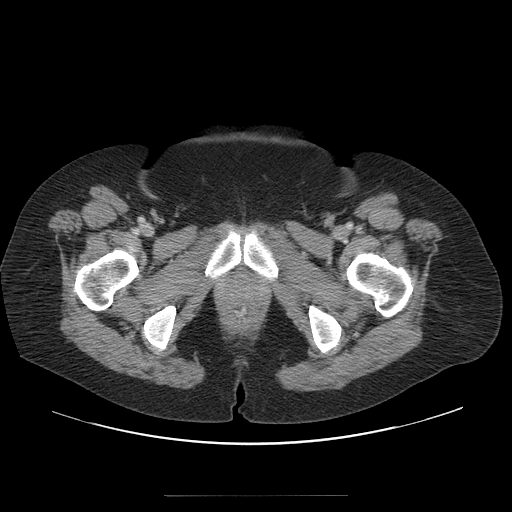
[im 5/79  bone]
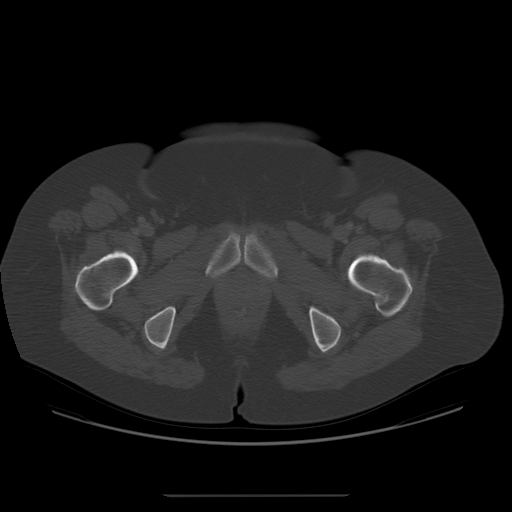
[im 13/79  soft-tissue]
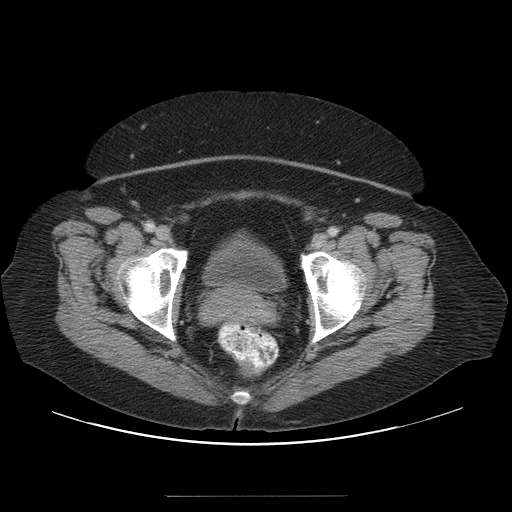
[im 17/79  soft-tissue]
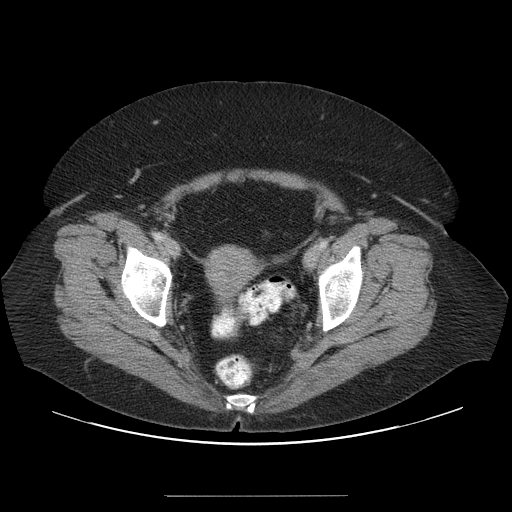
[im 21/79  soft-tissue]
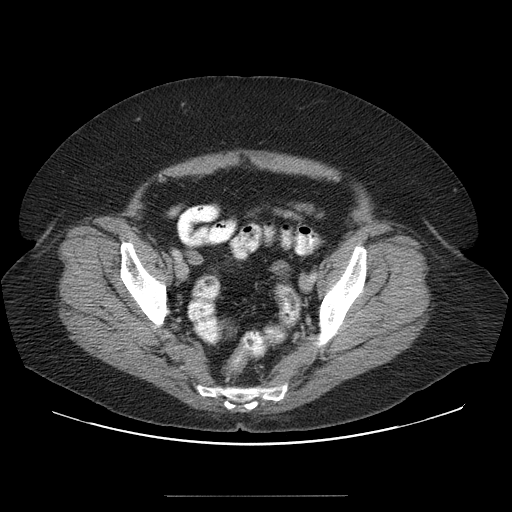
[im 29/79  soft-tissue]
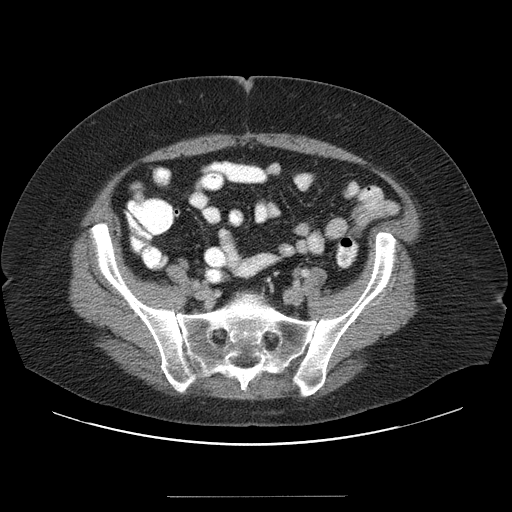
[im 33/79  soft-tissue]
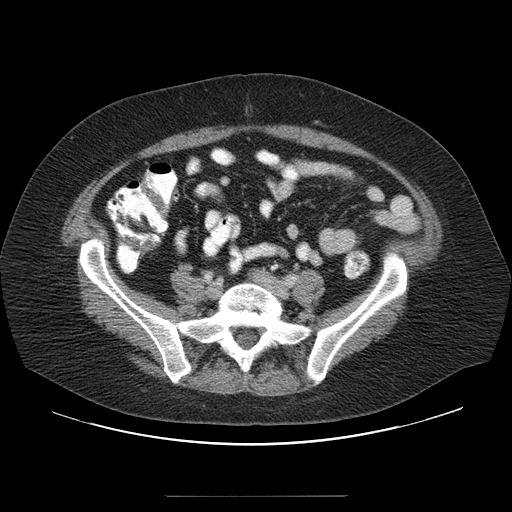
[im 42/79  soft-tissue]
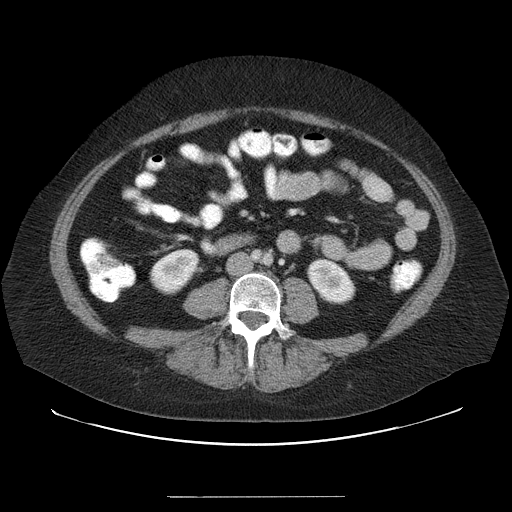
[im 46/79  soft-tissue]
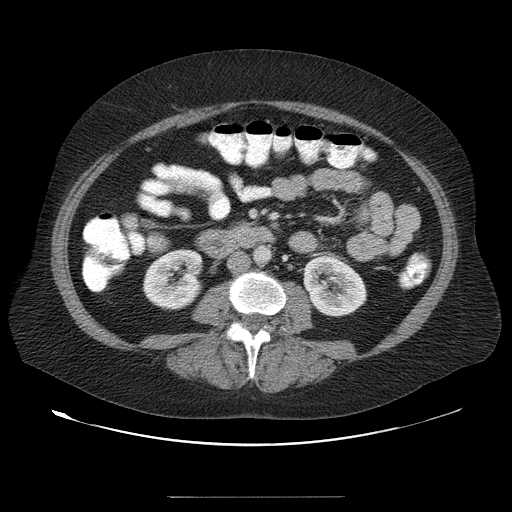
[im 50/79  soft-tissue]
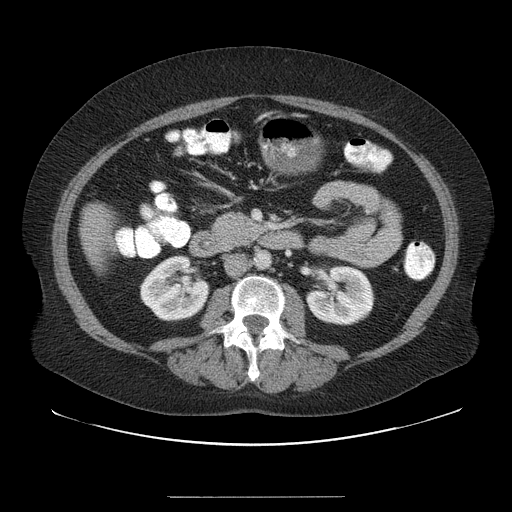
[im 50/79  bone]
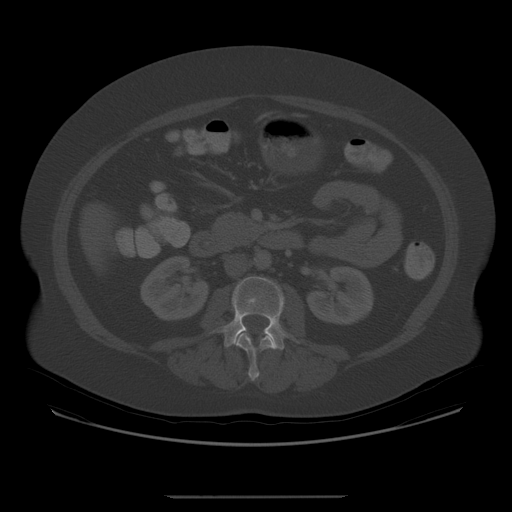
[im 58/79  soft-tissue]
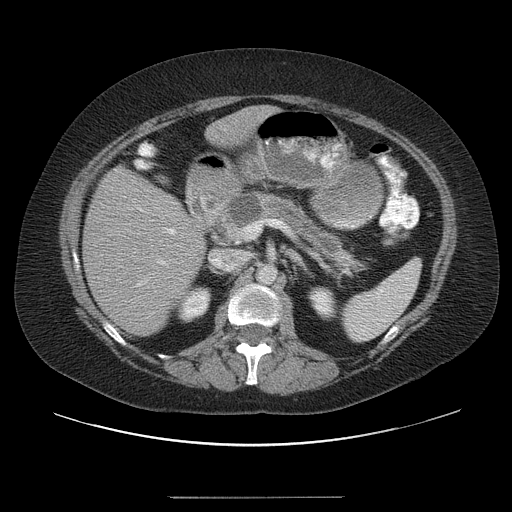
[im 62/79  soft-tissue]
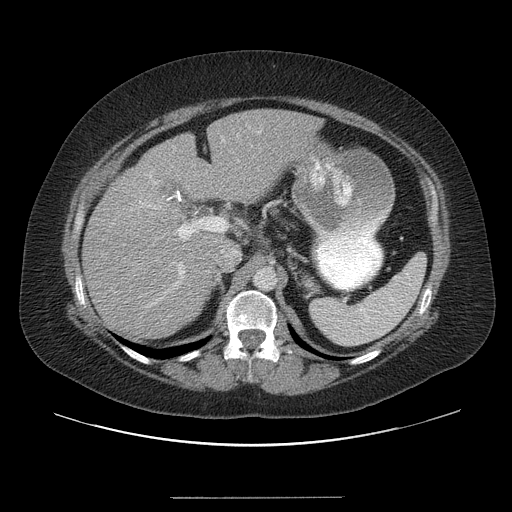
[im 66/79  soft-tissue]
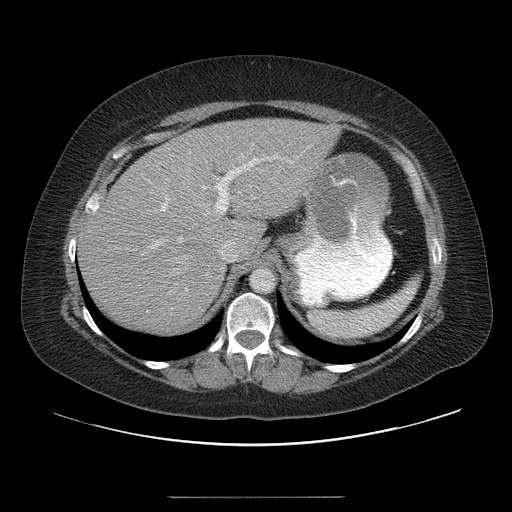
[im 74/79  soft-tissue]
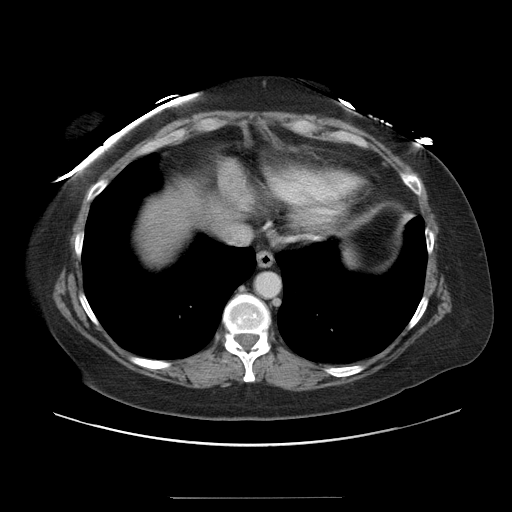

[Series 400: cor · coronal · 0.83mm/px · 3 of 157 slices shown]
[im 53/157  soft-tissue]
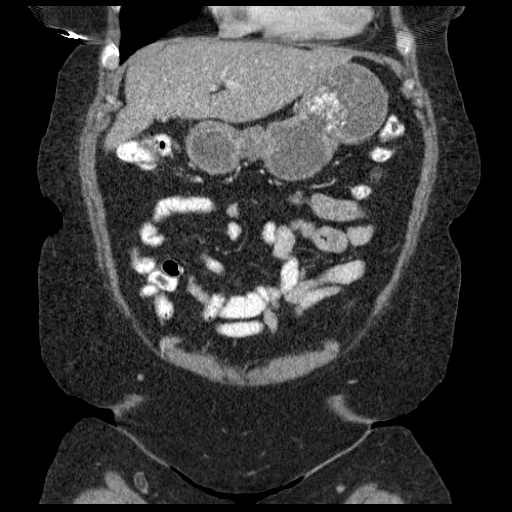
[im 70/157  soft-tissue]
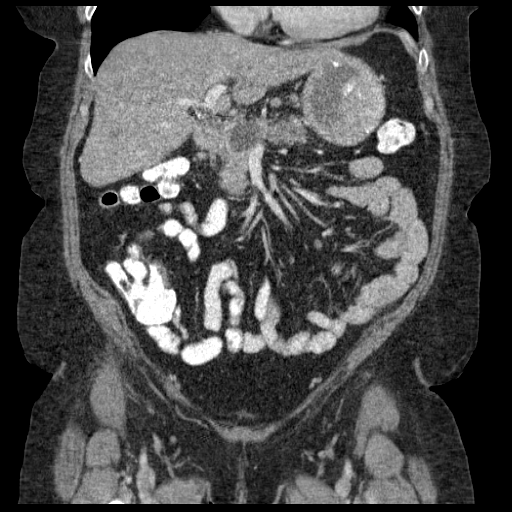
[im 87/157  soft-tissue]
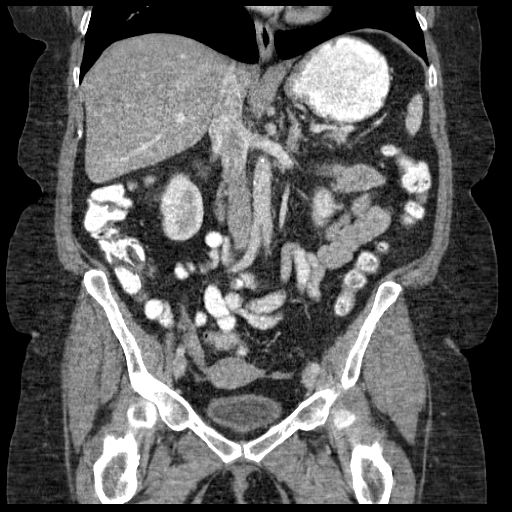

[16 of 46 positions shown; findings below may reference images not displayed]

FINDINGS: Lower chest: Potential ground-glass nodule measuring 11 mm in the
right lower lobe is incompletely completely imaged on the first
image. There is no pleural fluid or pericardial fluid

Hepatobiliary: No focal hepatic lesion. Patient status post
cholecystectomy. Biliary duct dilatation.

Pancreas: Interval reduction in size of the cystic lesion within the
distal pancreatic body (towards the pancreatic head) measuring 2.4 x
2.7 cm decreased from 4.2 x 5.3 cm. There is a second cystic lesion
in the tail of the pancreas measuring 15 mm x 12 mm which is new
from prior. There is mild pancreatic duct dilatation which is
unchanged. New peripancreatic fluid collections outside of
pancreatic body. N significant peripancreatic inflammation to
suggest active acute pancreatitis. Splenic vein is patent.

Spleen: Normal spleen.

Adrenals/urinary tract: Adrenal glands and kidneys are normal.
Ureters and bladder are normal.

Stomach/Bowel: Stomach, small bowel, appendix, cecum are normal. The
colon and rectosigmoid colon are normal.

Vascular/Lymphatic: Abdominal aorta is normal caliber. There is no
retroperitoneal or periportal lymphadenopathy. No pelvic
lymphadenopathy.

Reproductive: Uterus and ovaries are normal. No free fluid the
pelvis.

Musculoskeletal: No aggressive osseous lesion.

Other: No peritoneal disease.  No ascites.
IMPRESSION: 1. Interval reduction in size of the largest cystic lesion in the
distal body of the pancreas.
2. New smaller cystic lesion in the tail the pancreas.
3. Findings are consistent with chronic pancreatitis. No evidence of
active acute on chronic pancreatitis.
4. Potential ground-glass nodule in the right lower lobe. Recommend
CT chest without contrast for further evaluation.

## 2015-11-29 ENCOUNTER — Other Ambulatory Visit: Payer: Self-pay | Admitting: Internal Medicine

## 2015-12-21 ENCOUNTER — Other Ambulatory Visit: Payer: Self-pay | Admitting: Family Medicine

## 2015-12-30 ENCOUNTER — Other Ambulatory Visit: Payer: Self-pay | Admitting: Family Medicine

## 2016-01-09 ENCOUNTER — Other Ambulatory Visit: Payer: Self-pay | Admitting: Family Medicine

## 2016-02-21 ENCOUNTER — Other Ambulatory Visit: Payer: Self-pay | Admitting: Family Medicine

## 2016-02-29 ENCOUNTER — Other Ambulatory Visit: Payer: Self-pay | Admitting: Family Medicine

## 2016-04-04 ENCOUNTER — Other Ambulatory Visit: Payer: Self-pay | Admitting: Family Medicine

## 2016-04-05 ENCOUNTER — Other Ambulatory Visit: Payer: Self-pay | Admitting: Family Medicine

## 2016-04-19 ENCOUNTER — Encounter: Payer: Self-pay | Admitting: Family Medicine

## 2016-04-19 ENCOUNTER — Ambulatory Visit (INDEPENDENT_AMBULATORY_CARE_PROVIDER_SITE_OTHER): Payer: Medicaid Other | Admitting: Family Medicine

## 2016-04-19 VITALS — BP 153/92 | HR 99 | Temp 98.6°F | Resp 16 | Ht 62.0 in | Wt 177.0 lb

## 2016-04-19 DIAGNOSIS — I10 Essential (primary) hypertension: Secondary | ICD-10-CM | POA: Diagnosis not present

## 2016-04-19 LAB — CBC WITH DIFFERENTIAL/PLATELET
Basophils Absolute: 0 cells/uL (ref 0–200)
Basophils Relative: 0 %
EOS PCT: 3 %
Eosinophils Absolute: 315 cells/uL (ref 15–500)
HCT: 43.8 % (ref 35.0–45.0)
HEMOGLOBIN: 15.3 g/dL (ref 11.7–15.5)
LYMPHS ABS: 2415 {cells}/uL (ref 850–3900)
Lymphocytes Relative: 23 %
MCH: 34.9 pg — AB (ref 27.0–33.0)
MCHC: 34.9 g/dL (ref 32.0–36.0)
MCV: 100 fL (ref 80.0–100.0)
MPV: 9.9 fL (ref 7.5–12.5)
Monocytes Absolute: 735 cells/uL (ref 200–950)
Monocytes Relative: 7 %
NEUTROS ABS: 7035 {cells}/uL (ref 1500–7800)
NEUTROS PCT: 67 %
Platelets: 332 10*3/uL (ref 140–400)
RBC: 4.38 MIL/uL (ref 3.80–5.10)
RDW: 13 % (ref 11.0–15.0)
WBC: 10.5 10*3/uL (ref 3.8–10.8)

## 2016-04-19 LAB — COMPLETE METABOLIC PANEL WITH GFR
ALT: 60 U/L — AB (ref 6–29)
AST: 38 U/L — ABNORMAL HIGH (ref 10–35)
Albumin: 4.5 g/dL (ref 3.6–5.1)
Alkaline Phosphatase: 79 U/L (ref 33–130)
BUN: 18 mg/dL (ref 7–25)
CALCIUM: 9.8 mg/dL (ref 8.6–10.4)
CO2: 30 mmol/L (ref 20–31)
CREATININE: 0.92 mg/dL (ref 0.50–1.05)
Chloride: 101 mmol/L (ref 98–110)
GFR, Est African American: 83 mL/min (ref >=60)
GFR, Est Non African American: 72 mL/min (ref >=60)
Glucose, Bld: 109 mg/dL — ABNORMAL HIGH (ref 65–99)
Potassium: 3.7 mmol/L (ref 3.5–5.3)
SODIUM: 141 mmol/L (ref 135–146)
Total Bilirubin: 0.4 mg/dL (ref 0.2–1.2)
Total Protein: 7.2 g/dL (ref 6.1–8.1)

## 2016-04-19 LAB — LIPID PANEL
CHOL/HDL RATIO: 4.1 ratio (ref <=5.0)
Cholesterol: 215 mg/dL — ABNORMAL HIGH (ref 125–200)
HDL: 52 mg/dL (ref >=46)
LDL CALC: 126 mg/dL (ref <130)
Triglycerides: 184 mg/dL — ABNORMAL HIGH (ref <150)
VLDL: 37 mg/dL — AB (ref <30)

## 2016-04-19 LAB — POCT GLYCOSYLATED HEMOGLOBIN (HGB A1C): HEMOGLOBIN A1C: 6.1

## 2016-04-19 MED ORDER — AMLODIPINE BESYLATE 10 MG PO TABS: 5.0000 mg | ORAL_TABLET | Freq: Every day | ORAL | 2 refills | Status: DC

## 2016-04-19 MED ORDER — LISINOPRIL-HYDROCHLOROTHIAZIDE 20-25 MG PO TABS: 1.0000 | ORAL_TABLET | Freq: Every day | ORAL | 3 refills | Status: DC

## 2016-04-19 MED ORDER — FLUTICASONE-SALMETEROL 500-50 MCG/DOSE IN AEPB
INHALATION_SPRAY | RESPIRATORY_TRACT | 3 refills | Status: DC
Start: 2016-04-19 — End: 2016-10-20

## 2016-04-20 ENCOUNTER — Telehealth: Payer: Self-pay

## 2016-04-20 NOTE — Telephone Encounter (Signed)
Patient called back and I advised of labs and to eat low fat, low cholesterol diet. Appointment was scheduled for 3 months for follow up lipid panel. Thanks!

## 2016-04-20 NOTE — Telephone Encounter (Signed)
-----   Message from Micheline Chapman, NP sent at 04/20/2016  8:19 AM EDT ----- LFTs slightly elevated. Cholesterol total 215. Advise a low fat, low cholesterol diet, increased exercise and recheck in about 3 months.

## 2016-04-20 NOTE — Progress Notes (Signed)
[facility] Melinda [name], is a 55 y.o. female  [id number]  [id number]  DOB - [birth year]  CC:  Chief Complaint  Patient presents with  . Hypertension  . Back Pain       HPI: Melinda [name] is a 55 y.o. female here for follow-up for hypertension and back pain. She has a history of hypertension, asthma, GERD, arthritis, pancreatitis and headaches. She reports she still has some pain related to the pancreatitis. She has a history of an A1C of 5.8 but is not on medication, she controls her diabetes with diet.  She is on amlodipine 5 mg daily and lisinopril/hctz 20-25 for hypertension. She needs refills on these as well as advair and pro-air.   Health Maintenance: She is in need of HIV and Hep C screening. She is need of colonoscopy and PAP. She will be due for Prevnar in the fall.  Allergies  Allergen Reactions  . Codeine Itching and Nausea Only   Past Medical History:  Diagnosis Date  . Arthritis    knees  . Asthma   . Diabetes mellitus without complication (HCC)    diet controlled per pt.  Marland Kitchen GERD (gastroesophageal reflux disease)   . Headache    sinus now, hx of migraines last 2 or 3 years ago  . History of blood transfusion 2012   after gallbladder surgery  . Hypertension   . Pancreatitis 2 or 3 times a week   Current Outpatient Prescriptions on File Prior to Visit  Medication Sig Dispense Refill  . ibuprofen (ADVIL,MOTRIN) 800 MG tablet Take 800 mg by mouth every 8 (eight) hours as needed for mild pain or moderate pain.    Marland Kitchen PROAIR HFA 108 (90 BASE) MCG/ACT inhaler INHALE 2 PUFFS INTO THE LUNGS EVERY 6 (SIX) HOURS AS NEEDED FOR WHEEZING OR SHORTNESS OF BREATH. 8 Inhaler 0  . promethazine (PHENERGAN) 12.5 MG tablet Take 1 tablet (12.5 mg total) by mouth every 8 (eight) hours as needed for nausea or vomiting. 30 tablet 0  . cephALEXin (KEFLEX) 500 MG capsule Take 1 capsule (500 mg total) by mouth 2 (two) times daily. (Patient not taking: Reported on 04/19/2016) 14 capsule 0  .  ciprofloxacin (CIPRO) 500 MG tablet Take 1 tablet (500 mg total) by mouth 2 (two) times daily. (Patient not taking: Reported on 10/23/2014) 6 tablet 0  . ciprofloxacin (CIPRO) 500 MG tablet Take 1 tablet (500 mg total) by mouth 2 (two) times daily. (Patient not taking: Reported on 10/23/2014) 6 tablet 0  . ciprofloxacin (CIPRO) 500 MG tablet Take 1 tablet (500 mg total) by mouth 2 (two) times daily. (Patient not taking: Reported on 10/23/2014) 6 tablet 0   No current facility-administered medications on file prior to visit.    History reviewed. No pertinent family history. Social History   Social History  . Marital status: Married    Spouse name: N/A  . Number of children: N/A  . Years of education: N/A   Occupational History  . Not on file.   Social History Main Topics  . Smoking status: Current Some Day Smoker    Packs/day: 0.25    Years: 5.00    Types: Cigarettes  . Smokeless tobacco: Never Used     Comment: 5/day  . Alcohol use Yes     Comment: occasional  . Drug use: No  . Sexual activity: Not on file   Other Topics Concern  . Not on file   Social History Narrative  . No narrative on file  Review of Systems: Constitutional: Negative for fever, chills, appetite change, weight loss,  Fatigue. Skin: Negative for rashes or lesions of concern. HENT: Negative for ear pain, ear discharge.nose bleeds Eyes: Negative for pain, discharge, redness, itching and visual disturbance. Neck: Negative for pain, stiffness Respiratory: Negative for cough, shortness of breath,   Cardiovascular: Negative for chest pain, palpitations. Some swelling of feet and lower legs Gastrointestinal: Positive for intermittent abd pain, NVD related to prev. Pancreatis. + of heartburn Genitourinary: Negative for dysuria, urgency, frequency, hematuria,  Musculoskeletal: Positive for back and leg pain. No gait problem.Negative for weakness. Neurological: Negative for dizziness, tremors, seizures, syncope,    light-headedness, numbness and headaches.  Hematological: Negative for easy bruising or bleeding Psychiatric/Behavioral: Negative for depression, anxiety, decreased concentration, confusion   Objective:   Vitals:   04/19/16 1342 04/19/16 1347  BP: (!) 168/97 (!) 153/92  Pulse: 99   Resp: 16   Temp: 98.6 F (37 C)     Physical Exam: Constitutional: Patient appears well-developed and well-nourished. No distress. HENT: Normocephalic, atraumatic, External right and left ear normal. Oropharynx is clear and moist.  Eyes: Conjunctivae and EOM are normal. PERRLA, no scleral icterus. Neck: Normal ROM. Neck supple. No lymphadenopathy, No thyromegaly. CVS: RRR, S1/S2 +, no murmurs, no gallops, no rubs Pulmonary: Effort and breath sounds normal, no stridor, rhonchi, wheezes, rales.  Abdominal: Soft. Normoactive BS,, no distension, tenderness, rebound or guarding.  Musculoskeletal: Normal range of motion. No edema and no tenderness.  Neuro: Alert.Normal muscle tone coordination. Non-focal Skin: Skin is warm and dry. No rash noted. Not diaphoretic. No erythema. No pallor. Psychiatric: Normal mood and affect. Behavior, judgment, thought content normal.  Lab Results  Component Value Date   WBC 10.5 04/19/2016   HGB 15.3 04/19/2016   HCT 43.8 04/19/2016   MCV 100.0 04/19/2016   PLT 332 04/19/2016   Lab Results  Component Value Date   CREATININE 0.92 04/19/2016   BUN 18 04/19/2016   NA 141 04/19/2016   K 3.7 04/19/2016   CL 101 04/19/2016   CO2 30 04/19/2016    Lab Results  Component Value Date   HGBA1C 6.1 04/19/2016   Lipid Panel     Component Value Date/Time   CHOL 215 (H) 04/19/2016 1432   TRIG 184 (H) 04/19/2016 1432   HDL 52 04/19/2016 1432   CHOLHDL 4.1 04/19/2016 1432   VLDL 37 (H) 04/19/2016 1432   LDLCALC 126 04/19/2016 1432       Assessment and plan:   1. Essential hypertension  - COMPLETE METABOLIC PANEL WITH GFR - POCT HgB A1C - CBC with Differential -  Lipid panel - lisinopril-hydrochlorothiazide (PRINZIDE,ZESTORETIC) 20-25 MG tablet; Take 1 tablet by mouth daily.  Dispense: 90 tablet; Refill: 3 - amLODipine (NORVASC) 10 MG tablet; Take 0.5 tablets (5 mg total) by mouth daily.  Dispense: 30 tablet; Refill: 2 - Fluticasone-Salmeterol (ADVAIR DISKUS) 500-50 MCG/DOSE AEPB; INHALE 1 PUFF INTO THE LUNGS 2 (TWO) TIMES DAILY.  Dispense: 60 each; Refill: 3   Return in about 6 months (around 10/20/2016).  The patient was given clear instructions to go to ER or return to medical center if symptoms don't improve, worsen or new problems develop. The patient verbalized understanding.    Micheline Chapman FNP  04/20/2016, 1:44 PM

## 2016-04-20 NOTE — Telephone Encounter (Signed)
Called, no answer. Left message for patient to call back. Will try later. Thanks!

## 2016-04-25 ENCOUNTER — Other Ambulatory Visit (HOSPITAL_COMMUNITY): Payer: Self-pay | Admitting: Family Medicine

## 2016-04-25 DIAGNOSIS — M549 Dorsalgia, unspecified: Secondary | ICD-10-CM

## 2016-07-22 ENCOUNTER — Other Ambulatory Visit: Payer: Medicaid Other

## 2016-09-15 ENCOUNTER — Other Ambulatory Visit: Payer: Self-pay | Admitting: Family Medicine

## 2016-09-20 ENCOUNTER — Other Ambulatory Visit: Payer: Self-pay | Admitting: Family Medicine

## 2016-09-30 ENCOUNTER — Other Ambulatory Visit: Payer: Self-pay

## 2016-09-30 ENCOUNTER — Other Ambulatory Visit: Payer: Self-pay | Admitting: Family Medicine

## 2016-09-30 DIAGNOSIS — I10 Essential (primary) hypertension: Secondary | ICD-10-CM

## 2016-09-30 MED ORDER — LISINOPRIL-HYDROCHLOROTHIAZIDE 20-25 MG PO TABS: 1.0000 | ORAL_TABLET | Freq: Every day | ORAL | 3 refills | Status: DC

## 2016-09-30 NOTE — Telephone Encounter (Signed)
Sent in rx for lisinopril/ hctz to pharmacy. Thanks!

## 2016-10-07 ENCOUNTER — Other Ambulatory Visit: Payer: Self-pay | Admitting: Family Medicine

## 2016-10-10 ENCOUNTER — Other Ambulatory Visit: Payer: Self-pay | Admitting: Family Medicine

## 2016-10-10 ENCOUNTER — Encounter: Payer: Self-pay | Admitting: Family Medicine

## 2016-10-10 ENCOUNTER — Ambulatory Visit (INDEPENDENT_AMBULATORY_CARE_PROVIDER_SITE_OTHER): Payer: Medicaid Other | Admitting: Family Medicine

## 2016-10-10 ENCOUNTER — Ambulatory Visit (HOSPITAL_COMMUNITY)
Admission: RE | Admit: 2016-10-10 | Discharge: 2016-10-10 | Disposition: A | Discharge status: Home / Self Care | Payer: Medicaid Other | Source: Ambulatory Visit | Attending: Family Medicine | Admitting: Family Medicine

## 2016-10-10 VITALS — BP 148/80 | HR 100 | Temp 98.2°F | Resp 16 | Ht 62.0 in | Wt 176.0 lb

## 2016-10-10 DIAGNOSIS — F17200 Nicotine dependence, unspecified, uncomplicated: Secondary | ICD-10-CM

## 2016-10-10 DIAGNOSIS — S4990XA Unspecified injury of shoulder and upper arm, unspecified arm, initial encounter: Secondary | ICD-10-CM

## 2016-10-10 DIAGNOSIS — I10 Essential (primary) hypertension: Secondary | ICD-10-CM

## 2016-10-10 DIAGNOSIS — M25512 Pain in left shoulder: Secondary | ICD-10-CM | POA: Diagnosis not present

## 2016-10-10 DIAGNOSIS — Z114 Encounter for screening for human immunodeficiency virus [HIV]: Secondary | ICD-10-CM

## 2016-10-10 DIAGNOSIS — X58XXXA Exposure to other specified factors, initial encounter: Secondary | ICD-10-CM | POA: Diagnosis not present

## 2016-10-10 DIAGNOSIS — R7303 Prediabetes: Secondary | ICD-10-CM | POA: Diagnosis not present

## 2016-10-10 DIAGNOSIS — S4992XD Unspecified injury of left shoulder and upper arm, subsequent encounter: Secondary | ICD-10-CM

## 2016-10-10 LAB — COMPLETE METABOLIC PANEL WITH GFR
ALK PHOS: 73 U/L (ref 33–130)
ALT: 73 U/L — ABNORMAL HIGH (ref 6–29)
AST: 47 U/L — AB (ref 10–35)
Albumin: 4.7 g/dL (ref 3.6–5.1)
BILIRUBIN TOTAL: 0.5 mg/dL (ref 0.2–1.2)
BUN: 23 mg/dL (ref 7–25)
CO2: 21 mmol/L (ref 20–31)
Calcium: 10.2 mg/dL (ref 8.6–10.4)
Chloride: 101 mmol/L (ref 98–110)
Creat: 1.11 mg/dL — ABNORMAL HIGH (ref 0.50–1.05)
GFR, Est African American: 66 mL/min (ref >=60)
GFR, Est Non African American: 57 mL/min — ABNORMAL LOW (ref >=60)
GLUCOSE: 95 mg/dL (ref 65–99)
Potassium: 4.4 mmol/L (ref 3.5–5.3)
SODIUM: 137 mmol/L (ref 135–146)
TOTAL PROTEIN: 7.5 g/dL (ref 6.1–8.1)

## 2016-10-10 LAB — HIV ANTIBODY (ROUTINE TESTING W REFLEX): HIV 1&2 Ab, 4th Generation: NONREACTIVE

## 2016-10-10 LAB — POCT GLYCOSYLATED HEMOGLOBIN (HGB A1C): Hemoglobin A1C: 5.8

## 2016-10-10 MED ORDER — TRAMADOL HCL 50 MG PO TABS: 50.0000 mg | ORAL_TABLET | Freq: Four times a day (QID) | ORAL | 0 refills | Status: DC | PRN

## 2016-10-10 MED ORDER — KETOROLAC TROMETHAMINE 60 MG/2ML IM SOLN
60.0000 mg | Freq: Once | INTRAMUSCULAR | Status: AC
  Administered 2016-10-10: 60 mg via INTRAMUSCULAR

## 2016-10-10 NOTE — Patient Instructions (Addendum)
Shoulder Pain Many things can cause shoulder pain, including:  An injury.  Moving the arm in the same way again and again (overuse).  Joint pain (arthritis). Follow these instructions at home: Take these actions to help with your pain:  Squeeze a soft ball or a foam pad as much as you can. This helps to prevent swelling. It also makes the arm stronger.  Take over-the-counter and prescription medicines only as told by your doctor.  If told, put ice on the area:  Put ice in a plastic bag.  Place a towel between your skin and the bag.  Leave the ice on for 20 minutes, 2-3 times per day. Stop putting on ice if it does not help with the pain.  If you were given a shoulder sling or immobilizer:  Wear it as told.  Remove it to shower or bathe.  Move your arm as little as possible.  Keep your hand moving. This helps prevent swelling. Contact a doctor if:  Your pain gets worse.  Medicine does not help your pain.  You have new pain in your arm, hand, or fingers. Get help right away if:  Your arm, hand, or fingers:  Tingle.  Are numb.  Are swollen.  Are painful.  Turn white or blue. This information is not intended to replace advice given to you by your health care provider. Make sure you discuss any questions you have with your health care provider. Document Released: 02/22/2008 Document Revised: 05/01/2016 Document Reviewed: 12/29/2014 Elsevier Interactive Patient Education  2017 [employer]  

## 2016-10-10 NOTE — Progress Notes (Signed)
Subjective:    Patient ID: Melinda [name], female    DOB: 06-23-1961, 56 y.o.   MRN: [id number]  Hypertension  This is a chronic problem. The current episode started more than 1 year ago. The problem has been gradually worsening since onset. The problem is uncontrolled. Pertinent negatives include no anxiety, blurred vision, chest pain, headaches, malaise/fatigue, neck pain, orthopnea, palpitations, peripheral edema, PND, shortness of breath or sweats. There are no associated agents to hypertension. Risk factors for coronary artery disease include obesity, post-menopausal state and sedentary lifestyle (prediabetes). There is no history of angina, kidney disease, CAD/MI, CVA, heart failure, left ventricular hypertrophy, PVD, renovascular disease or retinopathy. There is no history of chronic renal disease, coarctation of the aorta, hyperaldosteronism, hypercortisolism, hyperparathyroidism, a hypertension causing med, pheochromocytoma, sleep apnea or a thyroid problem.  Shoulder Pain   The pain is present in the left shoulder. This is a new problem. The current episode started 1 to 4 weeks ago. There has been a history of trauma (Sustained injury while putting up christmas lights). The problem occurs intermittently. The pain is at a severity of 6/10. The pain is moderate. Associated symptoms include a limited range of motion, numbness and tingling. Pertinent negatives include no fever, inability to bear weight or itching. The symptoms are aggravated by activity. She has tried nothing for the symptoms. Her past medical history is significant for diabetes.    Past Medical History:  Diagnosis Date  . Arthritis    knees  . Asthma   . Diabetes mellitus without complication (HCC)    diet controlled per pt.  Marland Kitchen GERD (gastroesophageal reflux disease)   . Headache    sinus now, hx of migraines last 2 or 3 years ago  . History of blood transfusion 2012   after gallbladder surgery  . Hypertension   .  Pancreatitis 2 or 3 times a week   Immunization History  Administered Date(s) Administered  . Influenza,inj,Quad PF,36+ Mos 07/02/2014  . Pneumococcal Polysaccharide-23 07/02/2014   Social History   Social History  . Marital status: Married    Spouse name: N/A  . Number of children: N/A  . Years of education: N/A   Occupational History  . Not on file.   Social History Main Topics  . Smoking status: Current Some Day Smoker    Packs/day: 0.25    Years: 5.00    Types: Cigarettes  . Smokeless tobacco: Never Used     Comment: 5/day  . Alcohol use Yes     Comment: occasional  . Drug use: No  . Sexual activity: Not on file   Other Topics Concern  . Not on file   Social History Narrative  . No narrative on file   Review of Systems  Constitutional: Negative.  Negative for fever and malaise/fatigue.  HENT: Negative.   Eyes: Negative.  Negative for blurred vision.  Respiratory: Negative.  Negative for shortness of breath.   Cardiovascular: Negative for chest pain, palpitations, orthopnea and PND.  Gastrointestinal: Negative.   Endocrine: Negative.  Negative for polydipsia, polyphagia and polyuria.  Genitourinary: Negative.   Musculoskeletal: Positive for arthralgias (left shoulder). Negative for neck pain.  Skin: Negative for itching.  Allergic/Immunologic: Negative.   Neurological: Positive for tingling and numbness. Negative for headaches.  Hematological: Negative.   Psychiatric/Behavioral: Negative.        Objective:   Physical Exam  Constitutional: She is oriented to person, place, and time. She appears well-developed and well-nourished.  HENT:  Head:  Normocephalic and atraumatic.  Right Ear: External ear normal.  Left Ear: External ear normal.  Nose: Nose normal.  Mouth/Throat: Oropharynx is clear and moist.  Eyes: Conjunctivae and EOM are normal. Pupils are equal, round, and reactive to light.  Neck: Normal range of motion. Neck supple.  Cardiovascular:  Normal rate, regular rhythm, normal heart sounds and intact distal pulses.   Pulmonary/Chest: Effort normal and breath sounds normal.  Abdominal: Soft. Bowel sounds are normal.  Musculoskeletal:       Left shoulder: She exhibits decreased range of motion, tenderness, pain, spasm and decreased strength (3/5).  Neurological: She is alert and oriented to person, place, and time. She has normal reflexes.  Skin: Skin is warm and dry.  Psychiatric: She has a normal mood and affect. Her behavior is normal. Judgment and thought content normal.     BP (!) 148/80 (BP Location: Right Arm, Patient Position: Sitting, Cuff Size: Normal)   Pulse 100   Temp 98.2 F (36.8 C) (Oral)   Resp 16   Ht 5\' 2"  (1.575 [name])   Wt 176 lb (79.8 kg)   LMP 09/23/2011   SpO2 97%   BMI 32.19 kg/[name]   Assessment & Plan:  1. Shoulder injury, initial encounter - DG Shoulder Left; Future  2. Acute pain of left shoulder - ketorolac (TORADOL) injection 60 mg; Inject 2 mLs (60 mg total) into the muscle once. - traMADol (ULTRAM) 50 MG tablet; Take 1 tablet (50 mg total) by mouth every 6 (six) hours as needed.  Dispense: 30 tablet; Refill: 0  3. Essential hypertension Blood pressure is at goal on current medication regimen. .   - COMPLETE METABOLIC PANEL WITH GFR  4. Pre-diabetes Recommend a lowfat, low carbohydrate diet divided over 5-6 small meals, increase water intake to 6-8 glasses, and 150 minutes per week of cardiovascular exercise - HgB A1c - COMPLETE METABOLIC PANEL WITH GFR  5. Tobacco dependence Smoking cessation instruction/counseling given:  counseled patient on the dangers of tobacco use, advised patient to stop smoking, and reviewed strategies to maximize success  6. Screening for HIV (human immunodeficiency virus) - HIV antibody (with reflex)   Melinda M, FNP  RTC: 3 months for hypertension  The patient was given clear instructions to go to ER or return to medical center if symptoms do not  improve, worsen or new problems develop. The patient verbalized understanding. Will notify patient with laboratory results.

## 2016-10-10 NOTE — Progress Notes (Signed)
Called, spoke with patient. Advised of x ray results and that we will be referring her to ortho. Patient verbalized understanding. Thanks!

## 2016-10-10 NOTE — Progress Notes (Signed)
Reviewed shoulder xray, acromioclavicular joint degeneration present. Will send a referral to orthopedics for further work-up and evaluation.   Dorena Dew, FNP

## 2016-10-11 ENCOUNTER — Telehealth: Payer: Self-pay

## 2016-10-11 LAB — POCT URINALYSIS DIP (DEVICE)
BILIRUBIN URINE: NEGATIVE
Glucose, UA: NEGATIVE mg/dL
Hgb urine dipstick: NEGATIVE
Ketones, ur: NEGATIVE mg/dL
Leukocytes, UA: NEGATIVE
NITRITE: NEGATIVE
Protein, ur: NEGATIVE mg/dL
Specific Gravity, Urine: 1.025 (ref 1.005–1.030)
Urobilinogen, UA: 0.2 mg/dL (ref 0.0–1.0)
pH: 5.5 (ref 5.0–8.0)

## 2016-10-11 LAB — HEPATITIS PANEL, ACUTE
HCV Ab: NEGATIVE
HEP A IGM: NONREACTIVE
HEP B S AG: NEGATIVE
Hep B C IgM: NONREACTIVE

## 2016-10-11 NOTE — Telephone Encounter (Signed)
Called and spoke with patient, she states ortho needs [place] to contact them to approve referral.  I have called ortho and provided our NPI number for authorization. Thanks!

## 2016-10-20 ENCOUNTER — Other Ambulatory Visit: Payer: Self-pay | Admitting: Family Medicine

## 2016-10-20 DIAGNOSIS — I10 Essential (primary) hypertension: Secondary | ICD-10-CM

## 2016-10-25 ENCOUNTER — Other Ambulatory Visit: Payer: Self-pay | Admitting: Sports Medicine

## 2016-10-25 DIAGNOSIS — M5412 Radiculopathy, cervical region: Secondary | ICD-10-CM

## 2016-11-03 ENCOUNTER — Ambulatory Visit
Admission: RE | Admit: 2016-11-03 | Discharge: 2016-11-03 | Disposition: A | Discharge status: Home / Self Care | Payer: Medicaid Other | Source: Ambulatory Visit | Attending: Sports Medicine | Admitting: Sports Medicine

## 2016-11-03 DIAGNOSIS — M5412 Radiculopathy, cervical region: Secondary | ICD-10-CM

## 2017-01-09 ENCOUNTER — Ambulatory Visit: Payer: Medicaid Other | Admitting: Family Medicine

## 2017-02-14 ENCOUNTER — Other Ambulatory Visit: Payer: Self-pay

## 2017-02-14 DIAGNOSIS — I10 Essential (primary) hypertension: Secondary | ICD-10-CM

## 2017-02-14 MED ORDER — LISINOPRIL-HYDROCHLOROTHIAZIDE 20-25 MG PO TABS: 1.0000 | ORAL_TABLET | Freq: Every day | ORAL | 3 refills | Status: DC

## 2017-02-14 NOTE — Telephone Encounter (Signed)
Refilled Lisinopril/hctz to pharmacy. Thanks!

## 2017-04-14 ENCOUNTER — Other Ambulatory Visit: Payer: Self-pay

## 2017-04-14 DIAGNOSIS — I10 Essential (primary) hypertension: Secondary | ICD-10-CM

## 2017-04-14 MED ORDER — AMLODIPINE BESYLATE 10 MG PO TABS: 5.0000 mg | ORAL_TABLET | Freq: Every day | ORAL | 2 refills | Status: DC

## 2019-08-30 ENCOUNTER — Emergency Department (HOSPITAL_COMMUNITY)
Admission: EM | Admit: 2019-08-30 | Discharge: 2019-08-31 | Disposition: A | Discharge status: Home / Self Care | Payer: Self-pay | Attending: Emergency Medicine | Admitting: Emergency Medicine

## 2019-08-30 ENCOUNTER — Encounter (HOSPITAL_COMMUNITY): Payer: Self-pay | Admitting: Emergency Medicine

## 2019-08-30 ENCOUNTER — Other Ambulatory Visit: Payer: Self-pay

## 2019-08-30 DIAGNOSIS — E119 Type 2 diabetes mellitus without complications: Secondary | ICD-10-CM | POA: Insufficient documentation

## 2019-08-30 DIAGNOSIS — I10 Essential (primary) hypertension: Secondary | ICD-10-CM

## 2019-08-30 DIAGNOSIS — J45909 Unspecified asthma, uncomplicated: Secondary | ICD-10-CM | POA: Insufficient documentation

## 2019-08-30 DIAGNOSIS — F17210 Nicotine dependence, cigarettes, uncomplicated: Secondary | ICD-10-CM | POA: Insufficient documentation

## 2019-08-30 DIAGNOSIS — I159 Secondary hypertension, unspecified: Secondary | ICD-10-CM | POA: Insufficient documentation

## 2019-08-30 DIAGNOSIS — Z79899 Other long term (current) drug therapy: Secondary | ICD-10-CM | POA: Insufficient documentation

## 2019-08-30 MED ORDER — LISINOPRIL-HYDROCHLOROTHIAZIDE 20-25 MG PO TABS: 1.0000 | ORAL_TABLET | Freq: Every day | ORAL | Status: DC

## 2019-08-30 MED ORDER — AMLODIPINE BESYLATE 5 MG PO TABS
5.0000 mg | ORAL_TABLET | Freq: Every day | ORAL | Status: DC
  Administered 2019-08-31: 01:00:00 5 mg via ORAL
  Filled 2019-08-30: qty 1

## 2019-08-30 MED ORDER — LISINOPRIL 20 MG PO TABS
20.0000 mg | ORAL_TABLET | Freq: Every day | ORAL | Status: DC
  Administered 2019-08-31: 20 mg via ORAL
  Filled 2019-08-30: qty 1

## 2019-08-30 MED ORDER — HYDROCHLOROTHIAZIDE 25 MG PO TABS
25.0000 mg | ORAL_TABLET | Freq: Every day | ORAL | Status: DC
  Administered 2019-08-31: 01:00:00 25 mg via ORAL
  Filled 2019-08-30: qty 1

## 2019-08-30 NOTE — ED Triage Notes (Signed)
Pt states due to financial stress has been unable to refill her HTN medications for at least the last 2 months. Pt has been monitoring her BP at home and states it has been trending up along with a dull headache that comes and goes. Currently pt has no headache, dizziness or vision changes. Pt is alert and ox4 clear speech.  Very hypertensive in triage

## 2019-08-31 ENCOUNTER — Other Ambulatory Visit: Payer: Self-pay

## 2019-08-31 LAB — I-STAT CHEM 8, ED
BUN: 15 mg/dL (ref 6–20)
Calcium, Ion: 1.06 mmol/L — ABNORMAL LOW (ref 1.15–1.40)
Chloride: 101 mmol/L (ref 98–111)
Creatinine, Ser: 0.7 mg/dL (ref 0.44–1.00)
Glucose, Bld: 126 mg/dL — ABNORMAL HIGH (ref 70–99)
HCT: 48 % — ABNORMAL HIGH (ref 36.0–46.0)
Hemoglobin: 16.3 g/dL — ABNORMAL HIGH (ref 12.0–15.0)
Potassium: 3.8 mmol/L (ref 3.5–5.1)
Sodium: 140 mmol/L (ref 135–145)
TCO2: 29 mmol/L (ref 22–32)

## 2019-08-31 MED ORDER — AMLODIPINE BESYLATE 10 MG PO TABS: 5.0000 mg | ORAL_TABLET | Freq: Every day | ORAL | 1 refills | Status: DC

## 2019-08-31 MED ORDER — LISINOPRIL 20 MG PO TABS: 20.0000 mg | ORAL_TABLET | Freq: Every day | ORAL | 1 refills | Status: DC

## 2019-08-31 MED ORDER — HYDROCHLOROTHIAZIDE 25 MG PO TABS: 25.0000 mg | ORAL_TABLET | Freq: Every day | ORAL | 1 refills | Status: DC

## 2019-08-31 NOTE — ED Provider Notes (Signed)
[facility] EMERGENCY DEPARTMENT Provider Note  CSN: [id number] Arrival date & time: 08/30/19 1711  Chief Complaint(s) Hypertension  HPI Melinda [name] is a 58 y.o. female with a history of hypertension diabetes who presents to the emergency department with elevated blood pressures.  Patient reports that she has been off of her insurance for the last year and has not been able to see her primary care doctor to get prescriptions.  States that she has had leftover blood pressure medicine and has been using that throughout the year.  Reports that she ran out of blood pressure medicine 2 months ago.  She has been tracking her blood pressures and then been starting to trend up.  States that she has had blood pressures in the 160s.  She denies any associated headache, blurry vision, chest pain, shortness of breath, no peripheral edema.  No difficulty urinating.  HPI  Past Medical History Past Medical History:  Diagnosis Date  . Arthritis    knees  . Asthma   . Diabetes mellitus without complication (HCC)    diet controlled per pt.  Marland Kitchen GERD (gastroesophageal reflux disease)   . Headache    sinus now, hx of migraines last 2 or 3 years ago  . History of blood transfusion 2012   after gallbladder surgery  . Hypertension   . Pancreatitis 2 or 3 times a week   Patient Active Problem List   Diagnosis Date Noted  . Asthma, chronic 10/23/2014  . High triglycerides 10/13/2014  . Pre-diabetes 10/13/2014  . Tobacco dependence 07/21/2014  . Early satiety 07/02/2014  . Nausea 07/02/2014  . Abdominal pain, epigastric 07/02/2014  . History of chronic pancreatitis 07/02/2014  . Uncontrolled hypertension 06/10/2013  . Lipoma of back 05/27/2013  . Pancreatic pseudocyst/cyst 11/10/2012  . Pancreatitis 11/08/2012  . Elevated blood pressure 11/08/2012   Home Medication(s) Prior to Admission medications   Medication Sig Start Date End Date Taking? Authorizing Provider  ADVAIR DISKUS  500-50 MCG/DOSE AEPB INHALE 1 PUFF INTO THE LUNGS 2 (TWO) TIMES DAILY. 10/20/16   Micheline Chapman, NP  amLODipine (NORVASC) 10 MG tablet Take 0.5 tablets (5 mg total) by mouth daily. 08/31/19   Fatima Blank, MD  hydrochlorothiazide (HYDRODIURIL) 25 MG tablet Take 1 tablet (25 mg total) by mouth daily. 08/31/19 10/30/19  Fatima Blank, MD  ibuprofen (ADVIL,MOTRIN) 800 MG tablet Take 800 mg by mouth every 8 (eight) hours as needed for mild pain or moderate pain.    [provider]  lisinopril (ZESTRIL) 20 MG tablet Take 1 tablet (20 mg total) by mouth daily. 08/31/19 10/30/19  Fatima Blank, MD  lisinopril-hydrochlorothiazide (PRINZIDE,ZESTORETIC) 20-25 MG tablet Take 1 tablet by mouth daily. 02/14/17   Dorena Dew, FNP  PROAIR HFA 108 (90 BASE) MCG/ACT inhaler INHALE 2 PUFFS INTO THE LUNGS EVERY 6 (SIX) HOURS AS NEEDED FOR WHEEZING OR SHORTNESS OF BREATH. 09/09/15   Dorena Dew, FNP  promethazine (PHENERGAN) 12.5 MG tablet Take 1 tablet (12.5 mg total) by mouth every 8 (eight) hours as needed for nausea or vomiting. 07/02/14   Dorena Dew, FNP  traMADol (ULTRAM) 50 MG tablet Take 1 tablet (50 mg total) by mouth every 6 (six) hours as needed. 10/10/16   Dorena Dew, FNP  Past Surgical History Past Surgical History:  Procedure Laterality Date  . CESAREAN SECTION     x 1  . CHOLECYSTECTOMY  2012  . EUS N/A 11/14/2012   Procedure: FULL UPPER ENDOSCOPIC ULTRASOUND (EUS) RADIAL;  Surgeon: Arta Silence, MD;  Location: [facility] ENDOSCOPY;  Service: Endoscopy;  Laterality: N/A;  extended case to 90 min per Dr Paulita Fujita for definate FNA/JB  . EUS N/A 07/18/2014   Procedure: UPPER ENDOSCOPIC ULTRASOUND (EUS) LINEAR;  Surgeon: Beryle Beams, MD;  Location: [facility] END[facility];  Service: Endoscopy;  Laterality: N/A;  . EUS N/A 10/03/2014    Procedure: UPPER ENDOSCOPIC ULTRASOUND (EUS) LINEAR;  Surgeon: Beryle Beams, MD;  Location: [facility] ENDOS[facility];  Service: Endoscopy;  Laterality: N/A;   Family History No family history on file.  Social History Social History   Tobacco Use  . Smoking status: Current Some Day Smoker    Packs/day: 0.25    Years: 5.00    Pack years: 1.25    Types: Cigarettes  . Smokeless tobacco: Never Used  . Tobacco comment: 5/day  Substance Use Topics  . Alcohol use: Yes    Comment: occasional  . Drug use: No   Allergies Codeine  Review of Systems Review of Systems All other systems are reviewed and are negative for acute change except as noted in the HPI  Physical Exam Vital Signs  I have reviewed the triage vital signs BP (!) 201/105 (BP Location: Right Arm)   Pulse 86   Temp 98.7 F (37.1 C) (Oral)   Resp 18   Ht 5\' 2"  (1.575 m)   Wt 73 kg   LMP 09/23/2011   SpO2 97%   BMI 29.45 kg/m   Physical Exam Vitals reviewed.  Constitutional:      General: She is not in acute distress.    Appearance: She is well-developed. She is not diaphoretic.  HENT:     Head: Normocephalic and atraumatic.     Nose: Nose normal.  Eyes:     General: No scleral icterus.       Right eye: No discharge.        Left eye: No discharge.     Conjunctiva/sclera: Conjunctivae normal.     Pupils: Pupils are equal, round, and reactive to light.  Cardiovascular:     Rate and Rhythm: Normal rate and regular rhythm.     Heart sounds: No murmur. No friction rub. No gallop.   Pulmonary:     Effort: Pulmonary effort is normal. No respiratory distress.     Breath sounds: Normal breath sounds. No stridor. No rales.  Abdominal:     General: There is no distension.     Palpations: Abdomen is soft.     Tenderness: There is no abdominal tenderness.  Musculoskeletal:        General: No tenderness.     Cervical back: Normal range of motion and neck supple.  Skin:    General: Skin is warm and dry.     Findings: No  erythema or rash.  Neurological:     Mental Status: She is alert and oriented to person, place, and time.     ED Results and Treatments Labs (all labs ordered are listed, but only abnormal results are displayed) Labs Reviewed  I-STAT CHEM 8, ED - Abnormal; Notable for the following components:      Result Value   Glucose, Bld 126 (*)    Calcium, Ion 1.06 (*)    Hemoglobin 16.3 (*)  HCT 48.0 (*)    All other components within normal limits                                                                                                                         EKG  EKG Interpretation  Date/Time:  Friday August 30 2019 17:18:14 EST Ventricular Rate:  96 PR Interval:  158 QRS Duration: 66 QT Interval:  348 QTC Calculation: 439 R Axis:   95 Text Interpretation: Sinus rhythm with occasional Premature ventricular complexes and Fusion complexes Rightward axis Borderline ECG No STEMI, occasional PVC Reconfirmed by Addison Lank [phone]) on 08/31/2019 12:22:24 AM      Radiology No results found.  Pertinent labs & imaging results that were available during my care of the patient were reviewed by me and considered in my medical decision making (see chart for details).  Medications Ordered in ED Medications  amLODipine (NORVASC) tablet 5 mg (5 mg Oral Given 08/31/19 0032)  lisinopril (ZESTRIL) tablet 20 mg (20 mg Oral Given 08/31/19 0031)  hydrochlorothiazide (HYDRODIURIL) tablet 25 mg (25 mg Oral Given 08/31/19 0031)                                                                                                                                    Procedures Procedures  (including critical care time)  Medical Decision Making / ED Course I have reviewed the nursing notes for this encounter and the patient's prior records (if available in EHR or on provided paperwork).   Melinda [name] was evaluated in Emergency Department on 08/31/2019 for the symptoms described in the history of  present illness. She was evaluated in the context of the global COVID-19 pandemic, which necessitated consideration that the patient might be at risk for infection with the SARS-CoV-2 virus that causes COVID-19. Institutional protocols and algorithms that pertain to the evaluation of patients at risk for COVID-19 are in a state of rapid change based on information released by regulatory bodies including the CDC and federal and state organizations. These policies and algorithms were followed during the patient's care in the ED.  Asymptomatic hypertension with systolic blood pressures around 200.  EKG reassuring.  Chem-8 without evidence of renal insufficiency.  Patient was given home dose blood pressure meds.  The patient appears reasonably screened and/or stabilized for discharge and I doubt any other medical condition or other [facility] requiring further screening, evaluation, or  treatment in the ED at this time prior to discharge.  The patient is safe for discharge with strict return precautions.       Final Clinical Impression(s) / ED Diagnoses Final diagnoses:  Secondary hypertension     The patient appears reasonably screened and/or stabilized for discharge and I doubt any other medical condition or other [facility] requiring further screening, evaluation, or treatment in the ED at this time prior to discharge.  Disposition: Discharge  Condition: Good  I have discussed the results, Dx and Tx plan with the patient who expressed understanding and agree(s) with the plan. Discharge instructions discussed at great length. The patient was given strict return precautions who verbalized understanding of the instructions. No further questions at time of discharge.    ED Discharge Orders         Ordered    amLODipine (NORVASC) 10 MG tablet  Daily     08/31/19 0050    lisinopril (ZESTRIL) 20 MG tablet  Daily     08/31/19 0050    hydrochlorothiazide (HYDRODIURIL) 25 MG tablet  Daily     08/31/19 0050             Follow Up: Primary care provider  Schedule an appointment as soon as possible for a visit  If you do not have a primary care physician, contact [facility] at [phone] for referral     This chart was dictated using voice recognition software.  Despite best efforts to proofread,  errors can occur which can change the documentation meaning.   Fatima Blank, MD 08/31/19 [phone]

## 2022-04-26 ENCOUNTER — Other Ambulatory Visit: Payer: Self-pay | Admitting: Internal Medicine

## 2022-04-26 DIAGNOSIS — R7989 Other specified abnormal findings of blood chemistry: Secondary | ICD-10-CM

## 2023-12-13 ENCOUNTER — Encounter: Payer: Self-pay | Admitting: Family Medicine

## 2023-12-13 ENCOUNTER — Ambulatory Visit: Payer: Self-pay | Admitting: Family Medicine

## 2023-12-13 VITALS — BP 122/64 | HR 83 | Temp 98.5°F | Ht 62.0 in | Wt 141.0 lb

## 2023-12-13 DIAGNOSIS — Z1211 Encounter for screening for malignant neoplasm of colon: Secondary | ICD-10-CM

## 2023-12-13 DIAGNOSIS — E7849 Other hyperlipidemia: Secondary | ICD-10-CM

## 2023-12-13 DIAGNOSIS — R7303 Prediabetes: Secondary | ICD-10-CM

## 2023-12-13 DIAGNOSIS — F17200 Nicotine dependence, unspecified, uncomplicated: Secondary | ICD-10-CM | POA: Diagnosis not present

## 2023-12-13 DIAGNOSIS — J4520 Mild intermittent asthma, uncomplicated: Secondary | ICD-10-CM | POA: Diagnosis not present

## 2023-12-13 DIAGNOSIS — I10 Essential (primary) hypertension: Secondary | ICD-10-CM

## 2023-12-13 DIAGNOSIS — Z1231 Encounter for screening mammogram for malignant neoplasm of breast: Secondary | ICD-10-CM

## 2023-12-13 DIAGNOSIS — E538 Deficiency of other specified B group vitamins: Secondary | ICD-10-CM

## 2023-12-13 DIAGNOSIS — R634 Abnormal weight loss: Secondary | ICD-10-CM

## 2023-12-13 DIAGNOSIS — K760 Fatty (change of) liver, not elsewhere classified: Secondary | ICD-10-CM

## 2023-12-13 DIAGNOSIS — E781 Pure hyperglyceridemia: Secondary | ICD-10-CM

## 2023-12-13 MED ORDER — LOSARTAN POTASSIUM 50 MG PO TABS: 50.0000 mg | ORAL_TABLET | Freq: Every day | ORAL | 1 refills | Status: DC

## 2023-12-13 MED ORDER — ALBUTEROL SULFATE HFA 108 (90 BASE) MCG/ACT IN AERS: 1.0000 | INHALATION_SPRAY | RESPIRATORY_TRACT | 1 refills | Status: DC | PRN

## 2023-12-13 MED ORDER — METOPROLOL TARTRATE 25 MG PO TABS: 25.0000 mg | ORAL_TABLET | Freq: Two times a day (BID) | ORAL | 1 refills | Status: DC

## 2023-12-13 NOTE — Progress Notes (Unsigned)
 Patient Office Visit  Assessment & Plan:  Hypertension, unspecified type -     Losartan Potassium; Take 1 tablet (50 mg total) by mouth daily.  Dispense: 90 tablet; Refill: 1 -     Metoprolol Tartrate; Take 1 tablet (25 mg total) by mouth 2 (two) times daily.  Dispense: 180 tablet; Refill: 1 -     CBC with Differential/Platelet -     COMPLETE METABOLIC PANEL WITHOUT GFR  Mild intermittent chronic asthma without complication -     Albuterol Sulfate HFA; Inhale 1 puff into the lungs as needed for wheezing or shortness of breath.  Dispense: 8 g; Refill: 1  High triglycerides -     Lipid panel  Tobacco dependence  Prediabetes -     Hemoglobin A1c  Visit for screening mammogram -     3D Screening Mammogram, Left and Right; Future  Weight loss -     TSH  Screening for colon cancer -     Ambulatory referral to Gastroenterology  Vitamin B12 deficiency -     Vitamin B12  Fatty liver  Other hyperlipidemia -     Lipid panel   Test results were reviewed and analyzed as part of the medical decision making of this visit.  Reviewed previous notes and lab work from [facility] during the office visit.  GI consult ordered with Dr. Elnoria Howard in [place].  3D mammogram ordered.  Recommend tobacco sensation.  Patient will need lung cancer screening in the future.  Patient also will need Tdap and Shingrix and pneumococcal vaccines.  Patient is to return in the next 3 months or sooner if necessary.  Patient will need a physical in the future as well. Return in about 3 months (around 03/14/2024), or if symptoms worsen or fail to improve.   Subjective:    Patient ID: Melinda [name], female    DOB: 31-Jan-1961  Age: 63 y.o. MRN: [id number]  Chief Complaint  Patient presents with   Establish Care   Medical Management of Chronic Issues    HPI Patient is here to establish medical care and discuss chronic medical issues. Pt has not seen primary care MD for about 2 years. Was going to the [facility] in [place] Hyperlipidemia-pt not taking chol medication at this time HTN-using antihypertensive medication without difficulty.  Denies associated signs and symptoms including chest pain, shortness of breath, cough headache, peripheral swelling cramps spasms and palpitations.  Voices understanding of the potential for interference with blood pressure control with substances including high sodium intake, decongestions, herbal supplements weight loss supplements nutritional supplements.  Blood pressures at home are less than 140/90.   Prediabetes-.  Denies diarrhea, peripheral swelling, hypoglycemia, excessive thirst, excessive urination, visual fluctuation, and fatigue.  Making an effort on diet control and exercise.  Pt not taking medication for this.  Patient has lost weight the past few years. Weight loss- pt has not tried to lose weight but has lost about 20 pounds the past few years.  Previous hx of elevated LFTs- pt did see Dr. Elnoria Howard GI in [place] but has not seen him in a several years. Pt has previous history of chronic pancreatitis. Previous CT scan 2016/2015 showed fatty liver as well as pancreatic cysts. Pt is still consuming alcohol on a daily basis, 2 drinks per day.  Tobacco use- pt still smoking and is not ready to quit but is aware she needs to do so. Pt has not had lung cancer screening. Pt did have CT chest  done in 2016 which revealed a lung nodule. Asthma/?COPD- pt dates she has been diagnosed with asthma however with her tobacco use it is likely that she has COPD.  Patient uses albuterol inhaler twice per week.  Patient not having increasing shortness of breath wheezing or cough. Health Maintenance- has not had pap smear, no mammogram, no colon cancer screening, needs Shingrix, tdap and pneumococcal vaccines  The 10-year ASCVD risk score (Arnett DK, et al., 2019) is: 10.3%  Past Medical History:  Diagnosis Date   Allergy    Hay fever to pollen   Arthritis    knees   Asthma     Diabetes mellitus without complication (HCC)    diet controlled per pt.   GERD (gastroesophageal reflux disease)    Headache    sinus now, hx of migraines last 2 or 3 years ago   History of blood transfusion 09/19/2010   after gallbladder surgery   Hypertension    Pancreatitis 2 or 3 times a week   Past Surgical History:  Procedure Laterality Date   CESAREAN SECTION     x 1   CHOLECYSTECTOMY  2012   EUS N/A 11/14/2012   Procedure: FULL UPPER ENDOSCOPIC ULTRASOUND (EUS) RADIAL;  Surgeon: Willis Modena, MD;  Location: [facility];  Service: Endoscopy;  Laterality: N/A;  extended case to 90 min per Dr Dulce Sellar for definate FNA/JB   EUS N/A 07/18/2014   Procedure: UPPER ENDOSCOPIC ULTRASOUND (EUS) LINEAR;  Surgeon: Theda Belfast, MD;  Location: [facility];  Service: Endoscopy;  Laterality: N/A;   EUS N/A 10/03/2014   Procedure: UPPER ENDOSCOPIC ULTRASOUND (EUS) LINEAR;  Surgeon: Theda Belfast, MD;  Location: [facility];  Service: Endoscopy;  Laterality: N/A;   Social History   Tobacco Use   Smoking status: Every Day    Current packs/day: 0.50    Average packs/day: 0.4 packs/day for 12.6 years (5.1 ttl pk-yrs)    Types: Cigarettes   Smokeless tobacco: Never   Tobacco comments:    5/day  Substance Use Topics   Alcohol use: Yes    Alcohol/week: 10.0 standard drinks of alcohol    Types: 10 Standard drinks or equivalent per week    Comment: occasional   Drug use: No   Family History  Problem Relation Age of Onset   Vision loss Mother    Liver disease Mother    Kidney disease Mother    Cancer Sister    Asthma Maternal Aunt    Allergies  Allergen Reactions   Codeine Itching and Nausea Only    ROS    Objective:    BP 122/64   Pulse 83   Temp 98.5 F (36.9 C)   Ht 5\' 2"  (1.575 m)   Wt 141 lb (64 kg) Comment: pt reported  LMP 09/23/2011   SpO2 99%   BMI 25.79 kg/m  BP Readings from Last 3 Encounters:  12/13/23 122/64  08/31/19 (!) 189/96  10/10/16 (!)  148/80   Wt Readings from Last 3 Encounters:  12/13/23 141 lb (64 kg)  08/31/19 161 lb (73 kg)  10/10/16 176 lb (79.8 kg)    Physical Exam Vitals and nursing note reviewed.  Constitutional:      Appearance: Normal appearance.  HENT:     Head: Normocephalic.     Right Ear: Tympanic membrane, ear canal and external ear normal.     Left Ear: Tympanic membrane, ear canal and external ear normal.  Eyes:     Extraocular Movements: Extraocular  movements intact.     Conjunctiva/sclera: Conjunctivae normal.     Pupils: Pupils are equal, round, and reactive to light.  Cardiovascular:     Rate and Rhythm: Normal rate and regular rhythm.     Heart sounds: Normal heart sounds.  Pulmonary:     Effort: Pulmonary effort is normal.     Breath sounds: Normal breath sounds.  Musculoskeletal:     Right lower leg: No edema.     Left lower leg: No edema.  Neurological:     General: No focal deficit present.     Mental Status: She is alert and oriented to person, place, and time.  Psychiatric:        Mood and Affect: Mood normal.        Behavior: Behavior normal.        Thought Content: Thought content normal.        Judgment: Judgment normal.      Results for orders placed or performed in visit on 12/13/23  CBC with Differential/Platelet  Result Value Ref Range   WBC 7.7 3.8 - 10.8 Thousand/uL   RBC 3.96 3.80 - 5.10 Million/uL   Hemoglobin 13.9 11.7 - 15.5 g/dL   HCT 16.1 09.6 - 04.5 %   MCV 103.0 (H) 80.0 - 100.0 fL   MCH 35.1 (H) 27.0 - 33.0 pg   MCHC 34.1 32.0 - 36.0 g/dL   RDW 40.9 81.1 - 91.4 %   Platelets 277 140 - 400 Thousand/uL   MPV 10.5 7.5 - 12.5 fL   Neutro Abs 5,182 1,500 - 7,800 cells/uL   Absolute Lymphocytes 1,640 850 - 3,900 cells/uL   Absolute Monocytes 655 200 - 950 cells/uL   Eosinophils Absolute 162 15 - 500 cells/uL   Basophils Absolute 62 0 - 200 cells/uL   Neutrophils Relative % 67.3 %   Total Lymphocyte 21.3 %   Monocytes Relative 8.5 %   Eosinophils  Relative 2.1 %   Basophils Relative 0.8 %  COMPLETE METABOLIC PANEL WITH GFR  Result Value Ref Range   Glucose, Bld 278 (H) 65 - 99 mg/dL   BUN 10 7 - 25 mg/dL   Creat 7.82 9.56 - 2.13 mg/dL   BUN/Creatinine Ratio SEE NOTE: 6 - 22 (calc)   Sodium 131 (L) 135 - 146 mmol/L   Potassium 4.7 3.5 - 5.3 mmol/L   Chloride 94 (L) 98 - 110 mmol/L   CO2 27 20 - 32 mmol/L   Calcium 10.1 8.6 - 10.4 mg/dL   Total Protein 6.9 6.1 - 8.1 g/dL   Albumin 4.4 3.6 - 5.1 g/dL   Globulin 2.5 1.9 - 3.7 g/dL (calc)   AG Ratio 1.8 1.0 - 2.5 (calc)   Total Bilirubin 0.5 0.2 - 1.2 mg/dL   Alkaline phosphatase (APISO) 93 37 - 153 U/L   AST 39 (H) 10 - 35 U/L   ALT 42 (H) 6 - 29 U/L  Hemoglobin A1c  Result Value Ref Range   Hgb A1c MFr Bld 7.4 (H) <5.7 % of total Hgb   Mean Plasma Glucose 166 mg/dL   eAG (mmol/L) 9.2 mmol/L  Lipid panel  Result Value Ref Range   Cholesterol 243 (H) <200 mg/dL   HDL 50 > OR = 50 mg/dL   Triglycerides 086 (H) <150 mg/dL   LDL Cholesterol (Calc) 163 (H) mg/dL (calc)   Total CHOL/HDL Ratio 4.9 <5.0 (calc)   Non-HDL Cholesterol (Calc) 193 (H) <130 mg/dL (calc)  TSH  Result Value Ref  Range   TSH 2.53 0.40 - 4.50 mIU/L  Vitamin B12  Result Value Ref Range   Vitamin B-12 1,844 (H) 200 - 1,100 pg/mL

## 2023-12-14 ENCOUNTER — Encounter: Payer: Self-pay | Admitting: Family Medicine

## 2023-12-14 DIAGNOSIS — K760 Fatty (change of) liver, not elsewhere classified: Secondary | ICD-10-CM | POA: Insufficient documentation

## 2023-12-14 LAB — LIPID PANEL
Cholesterol: 243 mg/dL — ABNORMAL HIGH (ref <200)
HDL: 50 mg/dL (ref >=50)
LDL Cholesterol (Calc): 163 mg/dL — ABNORMAL HIGH
Non-HDL Cholesterol (Calc): 193 mg/dL — ABNORMAL HIGH (ref <130)
Total CHOL/HDL Ratio: 4.9 (calc) (ref <5.0)
Triglycerides: 153 mg/dL — ABNORMAL HIGH (ref <150)

## 2023-12-14 LAB — HEMOGLOBIN A1C
Hgb A1c MFr Bld: 7.4 %{Hb} — ABNORMAL HIGH (ref <5.7)
Mean Plasma Glucose: 166 mg/dL
eAG (mmol/L): 9.2 mmol/L

## 2023-12-14 LAB — COMPLETE METABOLIC PANEL WITHOUT GFR
AG Ratio: 1.8 (calc) (ref 1.0–2.5)
ALT: 42 U/L — ABNORMAL HIGH (ref 6–29)
AST: 39 U/L — ABNORMAL HIGH (ref 10–35)
Albumin: 4.4 g/dL (ref 3.6–5.1)
Alkaline phosphatase (APISO): 93 U/L (ref 37–153)
BUN: 10 mg/dL (ref 7–25)
CO2: 27 mmol/L (ref 20–32)
Calcium: 10.1 mg/dL (ref 8.6–10.4)
Chloride: 94 mmol/L — ABNORMAL LOW (ref 98–110)
Creat: 0.92 mg/dL (ref 0.50–1.05)
Globulin: 2.5 g/dL (ref 1.9–3.7)
Glucose, Bld: 278 mg/dL — ABNORMAL HIGH (ref 65–99)
Potassium: 4.7 mmol/L (ref 3.5–5.3)
Sodium: 131 mmol/L — ABNORMAL LOW (ref 135–146)
Total Bilirubin: 0.5 mg/dL (ref 0.2–1.2)
Total Protein: 6.9 g/dL (ref 6.1–8.1)

## 2023-12-14 LAB — CBC WITH DIFFERENTIAL/PLATELET
Absolute Lymphocytes: 1640 {cells}/uL (ref 850–3900)
Absolute Monocytes: 655 {cells}/uL (ref 200–950)
Basophils Absolute: 62 {cells}/uL (ref 0–200)
Basophils Relative: 0.8 %
Eosinophils Absolute: 162 {cells}/uL (ref 15–500)
Eosinophils Relative: 2.1 %
HCT: 40.8 % (ref 35.0–45.0)
Hemoglobin: 13.9 g/dL (ref 11.7–15.5)
MCH: 35.1 pg — ABNORMAL HIGH (ref 27.0–33.0)
MCHC: 34.1 g/dL (ref 32.0–36.0)
MCV: 103 fL — ABNORMAL HIGH (ref 80.0–100.0)
MPV: 10.5 fL (ref 7.5–12.5)
Monocytes Relative: 8.5 %
Neutro Abs: 5182 {cells}/uL (ref 1500–7800)
Neutrophils Relative %: 67.3 %
Platelets: 277 10*3/uL (ref 140–400)
RBC: 3.96 10*6/uL (ref 3.80–5.10)
RDW: 11.3 % (ref 11.0–15.0)
Total Lymphocyte: 21.3 %
WBC: 7.7 10*3/uL (ref 3.8–10.8)

## 2023-12-14 LAB — TSH: TSH: 2.53 m[IU]/L (ref 0.40–4.50)

## 2023-12-14 LAB — VITAMIN B12: Vitamin B-12: 1844 pg/mL — ABNORMAL HIGH (ref 200–1100)

## 2023-12-15 ENCOUNTER — Other Ambulatory Visit: Payer: Self-pay

## 2023-12-15 MED ORDER — ROSUVASTATIN CALCIUM 10 MG PO TABS: 10.0000 mg | ORAL_TABLET | Freq: Every day | ORAL | 0 refills | Status: DC

## 2023-12-15 MED ORDER — METFORMIN HCL ER 500 MG PO TB24: 500.0000 mg | ORAL_TABLET | Freq: Two times a day (BID) | ORAL | 0 refills | Status: DC

## 2023-12-18 ENCOUNTER — Other Ambulatory Visit: Payer: Self-pay

## 2023-12-18 MED ORDER — GLIPIZIDE 5 MG PO TABS: 5.0000 mg | ORAL_TABLET | Freq: Every day | ORAL | 3 refills | Status: DC

## 2024-01-25 ENCOUNTER — Encounter (HOSPITAL_COMMUNITY): Payer: Self-pay | Admitting: Internal Medicine

## 2024-01-25 ENCOUNTER — Emergency Department (HOSPITAL_COMMUNITY)

## 2024-01-25 ENCOUNTER — Other Ambulatory Visit: Payer: Self-pay

## 2024-01-25 ENCOUNTER — Inpatient Hospital Stay (HOSPITAL_COMMUNITY)
Admission: EM | Admit: 2024-01-25 | Discharge: 2024-01-27 | DRG: 638 | Disposition: A | Discharge status: Home / Self Care | Attending: Internal Medicine | Admitting: Internal Medicine

## 2024-01-25 ENCOUNTER — Observation Stay (HOSPITAL_COMMUNITY)

## 2024-01-25 DIAGNOSIS — W06XXXA Fall from bed, initial encounter: Secondary | ICD-10-CM | POA: Diagnosis present

## 2024-01-25 DIAGNOSIS — J45909 Unspecified asthma, uncomplicated: Secondary | ICD-10-CM | POA: Diagnosis present

## 2024-01-25 DIAGNOSIS — F1010 Alcohol abuse, uncomplicated: Secondary | ICD-10-CM | POA: Diagnosis not present

## 2024-01-25 DIAGNOSIS — R062 Wheezing: Secondary | ICD-10-CM

## 2024-01-25 DIAGNOSIS — E162 Hypoglycemia, unspecified: Secondary | ICD-10-CM | POA: Diagnosis not present

## 2024-01-25 DIAGNOSIS — R531 Weakness: Principal | ICD-10-CM

## 2024-01-25 DIAGNOSIS — I10 Essential (primary) hypertension: Secondary | ICD-10-CM | POA: Diagnosis present

## 2024-01-25 DIAGNOSIS — M170 Bilateral primary osteoarthritis of knee: Secondary | ICD-10-CM | POA: Diagnosis present

## 2024-01-25 DIAGNOSIS — Z841 Family history of disorders of kidney and ureter: Secondary | ICD-10-CM

## 2024-01-25 DIAGNOSIS — E871 Hypo-osmolality and hyponatremia: Secondary | ICD-10-CM | POA: Diagnosis present

## 2024-01-25 DIAGNOSIS — R32 Unspecified urinary incontinence: Secondary | ICD-10-CM | POA: Diagnosis present

## 2024-01-25 DIAGNOSIS — E860 Dehydration: Secondary | ICD-10-CM | POA: Diagnosis present

## 2024-01-25 DIAGNOSIS — I493 Ventricular premature depolarization: Secondary | ICD-10-CM | POA: Diagnosis present

## 2024-01-25 DIAGNOSIS — E785 Hyperlipidemia, unspecified: Secondary | ICD-10-CM | POA: Diagnosis present

## 2024-01-25 DIAGNOSIS — E7220 Disorder of urea cycle metabolism, unspecified: Secondary | ICD-10-CM

## 2024-01-25 DIAGNOSIS — F1090 Alcohol use, unspecified, uncomplicated: Secondary | ICD-10-CM

## 2024-01-25 DIAGNOSIS — D7589 Other specified diseases of blood and blood-forming organs: Secondary | ICD-10-CM | POA: Insufficient documentation

## 2024-01-25 DIAGNOSIS — F10239 Alcohol dependence with withdrawal, unspecified: Secondary | ICD-10-CM | POA: Diagnosis not present

## 2024-01-25 DIAGNOSIS — R251 Tremor, unspecified: Secondary | ICD-10-CM | POA: Diagnosis present

## 2024-01-25 DIAGNOSIS — Z79899 Other long term (current) drug therapy: Secondary | ICD-10-CM

## 2024-01-25 DIAGNOSIS — K861 Other chronic pancreatitis: Secondary | ICD-10-CM | POA: Diagnosis present

## 2024-01-25 DIAGNOSIS — Z8719 Personal history of other diseases of the digestive system: Secondary | ICD-10-CM

## 2024-01-25 DIAGNOSIS — Z885 Allergy status to narcotic agent status: Secondary | ICD-10-CM

## 2024-01-25 DIAGNOSIS — F17210 Nicotine dependence, cigarettes, uncomplicated: Secondary | ICD-10-CM

## 2024-01-25 DIAGNOSIS — R7989 Other specified abnormal findings of blood chemistry: Secondary | ICD-10-CM | POA: Insufficient documentation

## 2024-01-25 DIAGNOSIS — F10930 Alcohol use, unspecified with withdrawal, uncomplicated: Secondary | ICD-10-CM

## 2024-01-25 DIAGNOSIS — E11649 Type 2 diabetes mellitus with hypoglycemia without coma: Principal | ICD-10-CM | POA: Diagnosis present

## 2024-01-25 DIAGNOSIS — Z825 Family history of asthma and other chronic lower respiratory diseases: Secondary | ICD-10-CM

## 2024-01-25 DIAGNOSIS — K219 Gastro-esophageal reflux disease without esophagitis: Secondary | ICD-10-CM | POA: Diagnosis present

## 2024-01-25 DIAGNOSIS — R2681 Unsteadiness on feet: Secondary | ICD-10-CM | POA: Diagnosis present

## 2024-01-25 DIAGNOSIS — F10139 Alcohol abuse with withdrawal, unspecified: Secondary | ICD-10-CM | POA: Diagnosis present

## 2024-01-25 DIAGNOSIS — R2689 Other abnormalities of gait and mobility: Secondary | ICD-10-CM | POA: Diagnosis present

## 2024-01-25 DIAGNOSIS — Z1152 Encounter for screening for COVID-19: Secondary | ICD-10-CM

## 2024-01-25 DIAGNOSIS — E538 Deficiency of other specified B group vitamins: Secondary | ICD-10-CM | POA: Diagnosis present

## 2024-01-25 DIAGNOSIS — Y903 Blood alcohol level of 60-79 mg/100 ml: Secondary | ICD-10-CM | POA: Diagnosis present

## 2024-01-25 DIAGNOSIS — N179 Acute kidney failure, unspecified: Secondary | ICD-10-CM | POA: Diagnosis present

## 2024-01-25 DIAGNOSIS — Z7984 Long term (current) use of oral hypoglycemic drugs: Secondary | ICD-10-CM

## 2024-01-25 DIAGNOSIS — F17200 Nicotine dependence, unspecified, uncomplicated: Secondary | ICD-10-CM | POA: Diagnosis present

## 2024-01-25 DIAGNOSIS — Z9049 Acquired absence of other specified parts of digestive tract: Secondary | ICD-10-CM

## 2024-01-25 LAB — TROPONIN I (HIGH SENSITIVITY)
Troponin I (High Sensitivity): 22 ng/L — ABNORMAL HIGH (ref <18)
Troponin I (High Sensitivity): 32 ng/L — ABNORMAL HIGH (ref <18)

## 2024-01-25 LAB — COMPREHENSIVE METABOLIC PANEL WITH GFR
ALT: 37 U/L (ref 0–44)
AST: 46 U/L — ABNORMAL HIGH (ref 15–41)
Albumin: 3.7 g/dL (ref 3.5–5.0)
Alkaline Phosphatase: 80 U/L (ref 38–126)
Anion gap: 13 (ref 5–15)
BUN: 16 mg/dL (ref 6–20)
CO2: 20 mmol/L — ABNORMAL LOW (ref 22–32)
Calcium: 9.1 mg/dL (ref 8.9–10.3)
Chloride: 99 mmol/L (ref 98–111)
Creatinine, Ser: 1.08 mg/dL — ABNORMAL HIGH (ref 0.44–1.00)
GFR, Estimated: 59 mL/min — ABNORMAL LOW (ref >60)
Glucose, Bld: 219 mg/dL — ABNORMAL HIGH (ref 70–99)
Potassium: 5 mmol/L (ref 3.5–5.1)
Sodium: 132 mmol/L — ABNORMAL LOW (ref 135–145)
Total Bilirubin: 0.7 mg/dL (ref 0.0–1.2)
Total Protein: 7.5 g/dL (ref 6.5–8.1)

## 2024-01-25 LAB — CBC WITH DIFFERENTIAL/PLATELET
Abs Immature Granulocytes: 0.02 10*3/uL (ref 0.00–0.07)
Basophils Absolute: 0.1 10*3/uL (ref 0.0–0.1)
Basophils Relative: 1 %
Eosinophils Absolute: 0.1 10*3/uL (ref 0.0–0.5)
Eosinophils Relative: 2 %
HCT: 39.6 % (ref 36.0–46.0)
Hemoglobin: 13.4 g/dL (ref 12.0–15.0)
Immature Granulocytes: 0 %
Lymphocytes Relative: 28 %
Lymphs Abs: 1.9 10*3/uL (ref 0.7–4.0)
MCH: 35.2 pg — ABNORMAL HIGH (ref 26.0–34.0)
MCHC: 33.8 g/dL (ref 30.0–36.0)
MCV: 103.9 fL — ABNORMAL HIGH (ref 80.0–100.0)
Monocytes Absolute: 0.7 10*3/uL (ref 0.1–1.0)
Monocytes Relative: 10 %
Neutro Abs: 4 10*3/uL (ref 1.7–7.7)
Neutrophils Relative %: 59 %
Platelets: 257 10*3/uL (ref 150–400)
RBC: 3.81 MIL/uL — ABNORMAL LOW (ref 3.87–5.11)
RDW: 12.2 % (ref 11.5–15.5)
WBC: 6.8 10*3/uL (ref 4.0–10.5)
nRBC: 0 % (ref 0.0–0.2)

## 2024-01-25 LAB — RESP PANEL BY RT-PCR (RSV, FLU A&B, COVID)  RVPGX2
Influenza A by PCR: NEGATIVE
Influenza B by PCR: NEGATIVE
Resp Syncytial Virus by PCR: NEGATIVE
SARS Coronavirus 2 by RT PCR: NEGATIVE

## 2024-01-25 LAB — AMMONIA: Ammonia: 43 umol/L — ABNORMAL HIGH (ref 9–35)

## 2024-01-25 LAB — TSH: TSH: 2.478 u[IU]/mL (ref 0.350–4.500)

## 2024-01-25 LAB — MAGNESIUM: Magnesium: 2.2 mg/dL (ref 1.7–2.4)

## 2024-01-25 LAB — CBG MONITORING, ED
Glucose-Capillary: 180 mg/dL — ABNORMAL HIGH (ref 70–99)
Glucose-Capillary: 159 mg/dL — ABNORMAL HIGH (ref 70–99)

## 2024-01-25 LAB — VITAMIN B12: Vitamin B-12: 779 pg/mL (ref 180–914)

## 2024-01-25 LAB — ETHANOL: Alcohol, Ethyl (B): 66 mg/dL — ABNORMAL HIGH (ref <15)

## 2024-01-25 LAB — FOLATE: Folate: 9.4 ng/mL (ref >5.9)

## 2024-01-25 MED ORDER — THIAMINE HCL 100 MG/ML IJ SOLN: 500.0000 mg | Freq: Once | INTRAVENOUS | Status: DC

## 2024-01-25 MED ORDER — LORAZEPAM 1 MG PO TABS
0.0000 mg | ORAL_TABLET | Freq: Four times a day (QID) | ORAL | Status: DC
  Administered 2024-01-25 – 2024-01-26 (×2): 1 mg via ORAL
  Filled 2024-01-25 (×2): qty 1

## 2024-01-25 MED ORDER — LORAZEPAM 1 MG PO TABS: 0.0000 mg | ORAL_TABLET | Freq: Two times a day (BID) | ORAL | Status: DC

## 2024-01-25 MED ORDER — LORAZEPAM 2 MG/ML IJ SOLN: 1.0000 mg | INTRAMUSCULAR | Status: DC | PRN

## 2024-01-25 MED ORDER — LORAZEPAM 1 MG PO TABS: 1.0000 mg | ORAL_TABLET | ORAL | Status: DC | PRN

## 2024-01-25 MED ORDER — IPRATROPIUM-ALBUTEROL 0.5-2.5 (3) MG/3ML IN SOLN
3.0000 mL | RESPIRATORY_TRACT | Status: DC
  Administered 2024-01-26 (×2): 3 mL via RESPIRATORY_TRACT
  Filled 2024-01-25 (×3): qty 3

## 2024-01-25 MED ORDER — ADULT MULTIVITAMIN W/MINERALS CH
1.0000 | ORAL_TABLET | Freq: Every day | ORAL | Status: DC
  Administered 2024-01-26 – 2024-01-27 (×2): 1 via ORAL
  Filled 2024-01-25 (×2): qty 1

## 2024-01-25 MED ORDER — METOPROLOL TARTRATE 25 MG PO TABS
25.0000 mg | ORAL_TABLET | Freq: Two times a day (BID) | ORAL | Status: DC
  Administered 2024-01-25 – 2024-01-27 (×5): 25 mg via ORAL
  Filled 2024-01-25 (×5): qty 1

## 2024-01-25 MED ORDER — THIAMINE HCL 100 MG/ML IJ SOLN: 100.0000 mg | Freq: Every day | INTRAMUSCULAR | Status: DC

## 2024-01-25 MED ORDER — THIAMINE MONONITRATE 100 MG PO TABS: 100.0000 mg | ORAL_TABLET | Freq: Every day | ORAL | Status: DC

## 2024-01-25 MED ORDER — LACTATED RINGERS IV BOLUS
1000.0000 mL | Freq: Once | INTRAVENOUS | Status: AC
  Administered 2024-01-25: 1000 mL via INTRAVENOUS

## 2024-01-25 MED ORDER — VITAMIN B-12 1000 MCG PO TABS
500.0000 ug | ORAL_TABLET | Freq: Every day | ORAL | Status: DC
  Administered 2024-01-26 – 2024-01-27 (×2): 500 ug via ORAL
  Filled 2024-01-25 (×2): qty 1

## 2024-01-25 MED ORDER — IPRATROPIUM BROMIDE 0.02 % IN SOLN
0.5000 mg | Freq: Once | RESPIRATORY_TRACT | Status: AC
Start: 2024-01-25 — End: 2024-01-25
  Administered 2024-01-25: 0.5 mg via RESPIRATORY_TRACT
  Filled 2024-01-25: qty 2.5

## 2024-01-25 MED ORDER — LORAZEPAM 1 MG PO TABS: 0.5000 mg | ORAL_TABLET | Freq: Four times a day (QID) | ORAL | Status: DC

## 2024-01-25 MED ORDER — ROSUVASTATIN CALCIUM 5 MG PO TABS
10.0000 mg | ORAL_TABLET | Freq: Every day | ORAL | Status: DC
  Administered 2024-01-26 – 2024-01-27 (×2): 10 mg via ORAL
  Filled 2024-01-25 (×2): qty 2

## 2024-01-25 MED ORDER — THIAMINE HCL 100 MG/ML IJ SOLN
100.0000 mg | Freq: Every day | INTRAMUSCULAR | Status: DC
  Administered 2024-01-25: 100 mg via INTRAVENOUS
  Filled 2024-01-25: qty 2

## 2024-01-25 MED ORDER — ENOXAPARIN SODIUM 40 MG/0.4ML IJ SOSY
40.0000 mg | PREFILLED_SYRINGE | INTRAMUSCULAR | Status: DC
  Administered 2024-01-25 – 2024-01-26 (×2): 40 mg via SUBCUTANEOUS
  Filled 2024-01-25 (×2): qty 0.4

## 2024-01-25 MED ORDER — FOLIC ACID 1 MG PO TABS
1.0000 mg | ORAL_TABLET | Freq: Every day | ORAL | Status: DC
  Administered 2024-01-26 – 2024-01-27 (×2): 1 mg via ORAL
  Filled 2024-01-25 (×2): qty 1

## 2024-01-25 MED ORDER — LORAZEPAM 1 MG PO TABS: 0.5000 mg | ORAL_TABLET | Freq: Two times a day (BID) | ORAL | Status: DC

## 2024-01-25 MED ORDER — LORAZEPAM 1 MG PO TABS
0.0000 mg | ORAL_TABLET | Freq: Four times a day (QID) | ORAL | Status: DC
  Administered 2024-01-25: 1 mg via ORAL
  Filled 2024-01-25: qty 1

## 2024-01-25 MED ORDER — LORAZEPAM 2 MG/ML IJ SOLN
1.0000 mg | Freq: Once | INTRAMUSCULAR | Status: AC
  Administered 2024-01-25: 1 mg via INTRAVENOUS
  Filled 2024-01-25: qty 1

## 2024-01-25 MED ORDER — LOSARTAN POTASSIUM 50 MG PO TABS
50.0000 mg | ORAL_TABLET | Freq: Every day | ORAL | Status: DC
  Administered 2024-01-25 – 2024-01-27 (×3): 50 mg via ORAL
  Filled 2024-01-25 (×3): qty 1

## 2024-01-25 MED ORDER — VITAMIN B-12 500 MCG PO TABS: 500.0000 ug | ORAL_TABLET | Freq: Every day | ORAL | Status: DC

## 2024-01-25 MED ORDER — ALBUTEROL SULFATE (2.5 MG/3ML) 0.083% IN NEBU
5.0000 mg | INHALATION_SOLUTION | Freq: Once | RESPIRATORY_TRACT | Status: AC
  Administered 2024-01-25: 5 mg via RESPIRATORY_TRACT
  Filled 2024-01-25: qty 6

## 2024-01-25 NOTE — ED Provider Notes (Addendum)
 [place] EMERGENCY DEPARTMENT AT [facility] Provider Note  CSN: [id number] Arrival date & time: 01/25/24 4098  Chief Complaint(s) Irregular Heart Beat, Hypoglycemia, and Tremors  HPI Melinda [name] is a 63 y.o. female with PMH gallstone pancreatitis and associated chronic pancreatitis, diabetes on oral hypoglycemic agents who presents emerged part for evaluation of hypoglycemia, irregular heartbeat and tremors.  States that she has diarrhea constantly secondary to her pancreatic issues.  Was doing okay this morning when she woke up at about 6 AM but 2 hours later when she laid back down she was unable to get out of bed.  States that her hands were shaking at that time.  She was alert and did not pass out.  No postictal period EMS found the patient to be hypoglycemic on arrival with initial blood sugar of 58.  Also found her to be in bigeminy at that time.  Received dextrose and route and on arrival, patient has intermittent PVCs but is no longer in bigeminy.  Glucose on arrival was 180.  States that she feels "wiped" and fatigued but is denying chest pain, shortness of breath, abdominal pain, nausea, headache, fever or other systemic symptoms.   Past Medical History Past Medical History:  Diagnosis Date   Allergy    Hay fever to pollen   Arthritis    knees   Asthma    Diabetes mellitus without complication (HCC)    diet controlled per pt.   GERD (gastroesophageal reflux disease)    Headache    sinus now, hx of migraines last 2 or 3 years ago   History of blood transfusion 09/19/2010   after gallbladder surgery   Hypertension    Pancreatitis 2 or 3 times a week   Patient Active Problem List   Diagnosis Date Noted   Fatty liver 12/14/2023   Visit for screening mammogram 12/13/2023   Vitamin B12 deficiency 12/13/2023   Asthma, chronic 10/23/2014   Hyperlipidemia 10/13/2014   Pre-diabetes 10/13/2014   Tobacco dependence 07/21/2014   History of chronic pancreatitis  07/02/2014   Hypertension 06/10/2013   Lipoma of back 05/27/2013   Pancreatic pseudocyst/cyst 11/10/2012   Home Medication(s) Prior to Admission medications   Medication Sig Start Date End Date Taking? Authorizing Provider  albuterol  (VENTOLIN  HFA) 108 (90 Base) MCG/ACT inhaler Inhale 1 puff into the lungs as needed for wheezing or shortness of breath. 12/13/23  Yes Amadeo June, MD  glipiZIDE  (GLUCOTROL ) 5 MG tablet Take 1 tablet (5 mg total) by mouth daily before breakfast. 12/18/23  Yes Amadeo June, MD  losartan  (COZAAR ) 50 MG tablet Take 1 tablet (50 mg total) by mouth daily. 12/13/23  Yes Aguiar, Rafaela, MD  meclizine (ANTIVERT) 25 MG tablet Take 25 mg by mouth as needed for dizziness.   Yes [provider]  metoprolol  tartrate (LOPRESSOR ) 25 MG tablet Take 1 tablet (25 mg total) by mouth 2 (two) times daily. 12/13/23  Yes Amadeo June, MD  Nutritional Supplements (NUTRITIONAL SUPPLEMENT PO) Take by mouth. Sea moss, "better lungs", and "my biotin" brand biotin   Yes [provider]  rosuvastatin  (CRESTOR ) 10 MG tablet Take 1 tablet (10 mg total) by mouth daily. 12/15/23  Yes Amadeo June, MD  vitamin B-12 (CYANOCOBALAMIN) 500 MCG tablet Take 500 mcg by mouth daily.   Yes [provider]  vitamin D3 (CHOLECALCIFEROL) 25 MCG tablet Take 1,000 Units by mouth daily.   Yes [provider]  Past Surgical History Past Surgical History:  Procedure Laterality Date   CESAREAN SECTION     x 1   CHOLECYSTECTOMY  2012   EUS N/A 11/14/2012   Procedure: FULL UPPER ENDOSCOPIC ULTRASOUND (EUS) RADIAL;  Surgeon: Evangeline Hilts, MD;  Location: [facility];  Service: Endoscopy;  Laterality: N/A;  extended case to 90 min per Dr Kimble Pennant for definate FNA/JB   EUS N/A 07/18/2014   Procedure: UPPER ENDOSCOPIC ULTRASOUND (EUS) LINEAR;   Surgeon: Almeda Aris, MD;  Location: [facility];  Service: Endoscopy;  Laterality: N/A;   EUS N/A 10/03/2014   Procedure: UPPER ENDOSCOPIC ULTRASOUND (EUS) LINEAR;  Surgeon: Almeda Aris, MD;  Location: [facility];  Service: Endoscopy;  Laterality: N/A;   Family History Family History  Problem Relation Age of Onset   Vision loss Mother    Liver disease Mother    Kidney disease Mother    Cancer Sister    Asthma Maternal Aunt     Social History Social History   Tobacco Use   Smoking status: Every Day    Current packs/day: 0.50    Average packs/day: 0.4 packs/day for 12.6 years (5.1 ttl pk-yrs)    Types: Cigarettes   Smokeless tobacco: Never   Tobacco comments:    5/day  Substance Use Topics   Alcohol use: Yes    Alcohol/week: 10.0 standard drinks of alcohol    Types: 10 Standard drinks or equivalent per week    Comment: occasional   Drug use: No   Allergies Codeine  Review of Systems Review of Systems  Constitutional:  Positive for fatigue.  Neurological:  Positive for tremors.    Physical Exam Vital Signs  I have reviewed the triage vital signs BP (!) 153/70   Pulse 95   Temp 98 F (36.7 C) (Oral)   Resp (!) 21   Ht 5\' 2"  (1.575 m)   Wt 63.5 kg   LMP 09/23/2011   SpO2 98%   BMI 25.61 kg/m   Physical Exam Vitals and nursing note reviewed.  Constitutional:      General: She is not in acute distress.    Appearance: She is well-developed.  HENT:     Head: Normocephalic and atraumatic.  Eyes:     Conjunctiva/sclera: Conjunctivae normal.  Cardiovascular:     Rate and Rhythm: Normal rate and regular rhythm.     Heart sounds: No murmur heard. Pulmonary:     Effort: Pulmonary effort is normal. No respiratory distress.     Breath sounds: Normal breath sounds.  Abdominal:     Palpations: Abdomen is soft.     Tenderness: There is no abdominal tenderness.  Musculoskeletal:        General: No swelling.     Cervical back: Neck supple.  Skin:     General: Skin is warm and dry.     Capillary Refill: Capillary refill takes less than 2 seconds.  Neurological:     Mental Status: She is alert.     Coordination: Coordination abnormal.  Psychiatric:        Mood and Affect: Mood normal.     ED Results and Treatments Labs (all labs ordered are listed, but only abnormal results are displayed) Labs Reviewed  COMPREHENSIVE METABOLIC PANEL WITH GFR - Abnormal; Notable for the following components:      Result Value   Sodium 132 (*)    CO2 20 (*)    Glucose, Bld 219 (*)    Creatinine, Ser 1.08 (*)  AST 46 (*)    GFR, Estimated 59 (*)    All other components within normal limits  CBC WITH DIFFERENTIAL/PLATELET - Abnormal; Notable for the following components:   RBC 3.81 (*)    MCV 103.9 (*)    MCH 35.2 (*)    All other components within normal limits  AMMONIA - Abnormal; Notable for the following components:   Ammonia 43 (*)    All other components within normal limits  ETHANOL - Abnormal; Notable for the following components:   Alcohol, Ethyl (B) 66 (*)    All other components within normal limits  CBG MONITORING, ED - Abnormal; Notable for the following components:   Glucose-Capillary 180 (*)    All other components within normal limits  CBG MONITORING, ED - Abnormal; Notable for the following components:   Glucose-Capillary 159 (*)    All other components within normal limits  TROPONIN I (HIGH SENSITIVITY) - Abnormal; Notable for the following components:   Troponin I (High Sensitivity) 22 (*)    All other components within normal limits  TROPONIN I (HIGH SENSITIVITY) - Abnormal; Notable for the following components:   Troponin I (High Sensitivity) 32 (*)    All other components within normal limits  RESP PANEL BY RT-PCR (RSV, FLU A&B, COVID)  RVPGX2  MAGNESIUM  VITAMIN B12  FOLATE  TSH  VITAMIN E                                                                                                                           Radiology CT Head Wo Contrast Result Date: 01/25/2024 CLINICAL DATA:  New onset tremor. EXAM: CT HEAD WITHOUT CONTRAST TECHNIQUE: Contiguous axial images were obtained from the base of the skull through the vertex without intravenous contrast. RADIATION DOSE REDUCTION: This exam was performed according to the departmental dose-optimization program which includes automated exposure control, adjustment of the mA and/or kV according to patient size and/or use of iterative reconstruction technique. COMPARISON:  Head CT 07/31/2006 FINDINGS: Brain: There is no evidence of an acute infarct, intracranial hemorrhage, mass, midline shift, or extra-axial fluid collection. Cerebral volume is within normal limits for age. The ventricles are normal in size. Vascular: Calcified atherosclerosis at the skull base. No hyperdense vessel. Skull: No fracture or suspicious lesion. Sinuses/Orbits: Mild mucosal thickening in the maxillary sinuses. Moderate-sized left mastoid effusion. Unremarkable orbits. Other: None. IMPRESSION: 1. Unremarkable CT appearance of the brain. 2. Left mastoid effusion. Electronically Signed   By: Aundra Lee M.D.   On: 01/25/2024 12:08    Pertinent labs & imaging results that were available during my care of the patient were reviewed by me and considered in my medical decision making (see MDM for details).  Medications Ordered in ED Medications  losartan  (COZAAR ) tablet 50 mg (50 mg Oral Given 01/25/24 1044)  metoprolol  tartrate (LOPRESSOR ) tablet 25 mg (25 mg Oral Given 01/25/24 1044)  lactated ringers  bolus 1,000 mL (0 mLs Intravenous Stopped 01/25/24 1207)  LORazepam (ATIVAN) injection 1 mg (1 mg Intravenous Given 01/25/24 1243)                                                                                                                                     Procedures Procedures  (including critical care time)  Medical Decision Making / ED Course   This patient presents to the ED for  concern of tremor, this involves an extensive number of treatment options, and is a complaint that carries with it a high risk of complications and morbidity.  The differential diagnosis includes alcohol withdrawal, electrolyte abnormality, beriberi, Parkinson's, cerebellar pathology  MDM: Seen emergency room for evaluation of a tremor.  Physical exam reveals an intention tremor of the upper and lower extremities worse with finger-nose testing and heel-to-shin testing.  Laboratory evaluation with a sodium of 132, CO2 20, ammonia 43, high-sensitivity troponin 32, ethanol 66 normal mag.  Initial head CT unremarkable.  Spoke with the neurologist on-call Dr. Murvin Arthurs who is recommending an Ativan trial for alcohol withdrawal given patient's alcohol use.  This unfortunately did not have any significant effect.  He is then recommending MRI brain and a member of the neurology team will come and evaluate the patient at bedside.  Patient pending completion of imaging studies at time of signout.  Please see provider signout for continuation of workup.   Additional history obtained: -Additional history obtained from husband -External records from outside source obtained and reviewed including: Chart review including previous notes, labs, imaging, consultation notes   Lab Tests: -I ordered, reviewed, and interpreted labs.   The pertinent results include:   Labs Reviewed  COMPREHENSIVE METABOLIC PANEL WITH GFR - Abnormal; Notable for the following components:      Result Value   Sodium 132 (*)    CO2 20 (*)    Glucose, Bld 219 (*)    Creatinine, Ser 1.08 (*)    AST 46 (*)    GFR, Estimated 59 (*)    All other components within normal limits  CBC WITH DIFFERENTIAL/PLATELET - Abnormal; Notable for the following components:   RBC 3.81 (*)    MCV 103.9 (*)    MCH 35.2 (*)    All other components within normal limits  AMMONIA - Abnormal; Notable for the following components:   Ammonia 43 (*)    All  other components within normal limits  ETHANOL - Abnormal; Notable for the following components:   Alcohol, Ethyl (B) 66 (*)    All other components within normal limits  CBG MONITORING, ED - Abnormal; Notable for the following components:   Glucose-Capillary 180 (*)    All other components within normal limits  CBG MONITORING, ED - Abnormal; Notable for the following components:   Glucose-Capillary 159 (*)    All other components within normal limits  TROPONIN I (HIGH SENSITIVITY) - Abnormal; Notable for the following components:   Troponin I (High  Sensitivity) 22 (*)    All other components within normal limits  TROPONIN I (HIGH SENSITIVITY) - Abnormal; Notable for the following components:   Troponin I (High Sensitivity) 32 (*)    All other components within normal limits  RESP PANEL BY RT-PCR (RSV, FLU A&B, COVID)  RVPGX2  MAGNESIUM  VITAMIN B12  FOLATE  TSH  VITAMIN E      EKG   EKG Interpretation Date/Time:  Thursday Jan 25 2024 10:36:13 EDT Ventricular Rate:  104 PR Interval:  153 QRS Duration:  91 QT Interval:  392 QTC Calculation: 398 R Axis:   73  Text Interpretation: Sinus tachycardia Ventricular bigeminy Confirmed by Sherolyn Trettin ([id number]) on 01/25/2024 10:37:11 AM         Imaging Studies ordered: I ordered imaging studies including CT head I independently visualized and interpreted imaging. I agree with the radiologist interpretation  MRI brain pending   Medicines ordered and prescription drug management: Meds ordered this encounter  Medications   losartan  (COZAAR ) tablet 50 mg   metoprolol  tartrate (LOPRESSOR ) tablet 25 mg   lactated ringers  bolus 1,000 mL   LORazepam (ATIVAN) injection 1 mg    -I have reviewed the patients home medicines and have made adjustments as needed  Critical interventions none  Consultations Obtained: I requested consultation with the neurology team on-call,  and discussed lab and imaging findings as well as  pertinent plan - they recommend: Ativan trial, MRI brain   Cardiac Monitoring: The patient was maintained on a cardiac monitor.  I personally viewed and interpreted the cardiac monitored which showed an underlying rhythm of: NSR, bigeminy  Social Determinants of Health:  Factors impacting patients care include: Daily alcohol use   Reevaluation: After the interventions noted above, I reevaluated the patient and found that they have :stayed the same  Co morbidities that complicate the patient evaluation  Past Medical History:  Diagnosis Date   Allergy    Hay fever to pollen   Arthritis    knees   Asthma    Diabetes mellitus without complication (HCC)    diet controlled per pt.   GERD (gastroesophageal reflux disease)    Headache    sinus now, hx of migraines last 2 or 3 years ago   History of blood transfusion 09/19/2010   after gallbladder surgery   Hypertension    Pancreatitis 2 or 3 times a week      Dispostion: I considered admission for this patient, and disposition pending completion of imaging studies.  Please see provider signout note for continuation of workup.     Final Clinical Impression(s) / ED Diagnoses Final diagnoses:  None     @PCDICTATION @    Karlyn Overman, MD 01/25/24 1416    Karlyn Overman, MD 01/25/24 1417

## 2024-01-25 NOTE — TOC CM/SW Note (Signed)
 SW added Substance Abuse Resources to patient's AVS.    .Winfield Hau, MSW, LCSWA Transition of Care  Clinical Social Worker (ED 3-11 Mon-Fri)  [phone]

## 2024-01-25 NOTE — Consult Note (Signed)
 NEUROLOGY CONSULT NOTE   Date of service: Jan 25, 2024 Patient Name: Cellie Tassey MRN:  [id number] DOB:  November 17, 1960 Chief Complaint: "generalized weakness and shakes" Requesting Provider: Karlyn Overman, MD  History of Present Illness  Aizza Thompsen is a 63 y.o. female past hx of HTN, Asthma, DM, GERD, gallstone pancreatitis and associated chronic pancreatitis who presented with with hypoglycemia, irregular heart beat and tremors.    On assessment, patient interviewed in her room with mother-in-law and husband bedside. Patient was able to walk earlier at 6 AM this morning, returned to bed for 2 hours. She was unable to sit up in bed and eventually slid off the bed unto the floor. Associated symptoms include generalized weakness , dizziness and "hand shaky". She also reports issues with balance for 6 months - 1 year and feeling "unstable" walking. Patient denies symptoms of lightheadedness, headache, loss of consciousness or seizures.    When questioned about her drinking behavior, family members were asked to leave the room for privacy. Once alone, patient reports that she does drink 6 shots of vodka daily. Most recent drink was 1 AM this morning and chronically drinks this amount for a few years. She smokes cigarettes 3/4 PPD. She denies any any other recreational drug use.   Patient also reports increased cough which started a few days ago and has required her to use her albuterol  inhaler x2-3 times weekly.  She denies fever, chills, nausea, emesis or recent sick contacts. Patient reports that at baseline she eats small amounts of meat , vegetables and dairy.   ROS  Comprehensive ROS performed and pertinent positives documented in HPI   Past History   Past Medical History:  Diagnosis Date   Allergy    Hay fever to pollen   Arthritis    knees   Asthma    Diabetes mellitus without complication (HCC)    diet controlled per pt.   GERD (gastroesophageal reflux disease)    Headache    sinus  now, hx of migraines last 2 or 3 years ago   History of blood transfusion 09/19/2010   after gallbladder surgery   Hypertension    Pancreatitis 2 or 3 times a week    Past Surgical History:  Procedure Laterality Date   CESAREAN SECTION     x 1   CHOLECYSTECTOMY  2012   EUS N/A 11/14/2012   Procedure: FULL UPPER ENDOSCOPIC ULTRASOUND (EUS) RADIAL;  Surgeon: Evangeline Hilts, MD;  Location: [facility];  Service: Endoscopy;  Laterality: N/A;  extended case to 90 min per Dr Kimble Pennant for definate FNA/JB   EUS N/A 07/18/2014   Procedure: UPPER ENDOSCOPIC ULTRASOUND (EUS) LINEAR;  Surgeon: Almeda Aris, MD;  Location: [facility];  Service: Endoscopy;  Laterality: N/A;   EUS N/A 10/03/2014   Procedure: UPPER ENDOSCOPIC ULTRASOUND (EUS) LINEAR;  Surgeon: Almeda Aris, MD;  Location: [facility];  Service: Endoscopy;  Laterality: N/A;    Family History: Family History  Problem Relation Age of Onset   Vision loss Mother    Liver disease Mother    Kidney disease Mother    Cancer Sister    Asthma Maternal Aunt     Social History  reports that she has been smoking cigarettes. She has a 5.1 pack-year smoking history. She has never used smokeless tobacco. She reports current alcohol use of about 10.0 standard drinks of alcohol per week. She reports that she does not use drugs.  Allergies  Allergen Reactions   Codeine Itching  and Nausea Only    Medications   Current Facility-Administered Medications:    losartan  (COZAAR ) tablet 50 mg, 50 mg, Oral, Daily, Kommor, Madison, MD, 50 mg at 01/25/24 1044   metoprolol  tartrate (LOPRESSOR ) tablet 25 mg, 25 mg, Oral, BID, Kommor, Madison, MD, 25 mg at 01/25/24 1044  Current Outpatient Medications:    albuterol  (VENTOLIN  HFA) 108 (90 Base) MCG/ACT inhaler, Inhale 1 puff into the lungs as needed for wheezing or shortness of breath., Disp: 8 g, Rfl: 1   glipiZIDE  (GLUCOTROL ) 5 MG tablet, Take 1 tablet (5 mg total) by mouth daily before breakfast.,  Disp: 30 tablet, Rfl: 3   losartan  (COZAAR ) 50 MG tablet, Take 1 tablet (50 mg total) by mouth daily., Disp: 90 tablet, Rfl: 1   meclizine (ANTIVERT) 25 MG tablet, Take 25 mg by mouth as needed for dizziness., Disp: , Rfl:    metoprolol  tartrate (LOPRESSOR ) 25 MG tablet, Take 1 tablet (25 mg total) by mouth 2 (two) times daily., Disp: 180 tablet, Rfl: 1   Nutritional Supplements (NUTRITIONAL SUPPLEMENT PO), Take by mouth. Sea moss, "better lungs", and "my biotin" brand biotin, Disp: , Rfl:    rosuvastatin  (CRESTOR ) 10 MG tablet, Take 1 tablet (10 mg total) by mouth daily., Disp: 90 tablet, Rfl: 0   vitamin B-12 (CYANOCOBALAMIN) 500 MCG tablet, Take 500 mcg by mouth daily., Disp: , Rfl:    vitamin D3 (CHOLECALCIFEROL) 25 MCG tablet, Take 1,000 Units by mouth daily., Disp: , Rfl:   Vitals   Vitals:   01/25/24 1044 01/25/24 1215 01/25/24 1244 01/25/24 1323  BP: (!) 142/72 (!) 152/81 (!) 153/70   Pulse: 93 93 95   Resp:  (!) 21    Temp:    98 F (36.7 C)  TempSrc:    Oral  SpO2:  98%    Weight:      Height:        Body mass index is 25.61 kg/m.  Physical Exam   Constitutional: Appears ill, skin flushed red  Psych: Affect appropriate to situation.  Eyes: No scleral injection.  HENT: No OP obstruction.  Head: Normocephalic.  Cardiovascular: Normal rate per telemetry  Respiratory: Effort normal, non-labored breathing.  Skin: WDI.  Neurologic Examination   Neurological Exam:  Mental status/Cognition:  Alert, oriented x 4.  Eyes Open, eye contact appropriate, Pt has intact attention. Pt has normal concentration. Pt is able to follow simple step commands. Pt has normal comprehension.  Speech/language: Patient does not have dysarthria, normal volume, normal fluency, Normal repeat, 5/5 identify objects (thumb, pinky, skin color, bracelet,glove.    Cranial nerves:   CN II Pupils equal and reactive to light, pt blinks to threat bilaterally.   CN III,IV,VI EOM intact, no gaze  preference or deviation, no nystagmus   CN V normal sensation in V1, V2, and V3 segments bilaterally   CN VII no asymmetry, no nasolabial fold flattening   CN VIII Makes eye contact to speech.   CN IX & X normal palatal elevation, no uvular deviation   CN XI 5/5 head turn and 5/5 shoulder shrug bilaterally   CN XII midline tongue protrusion    Motor:  Muscle bulk: normal, tone normal  Drift: No drift noted  Strength Testing Left Right  Head turn 5/5 5/5  Shoulder shrug (C5) 5/5 5/5  Hand grip (C8-T1) 5/5 5/5  Deltoid Up (C5-C6) 5/5 5/5  Deltoid Down (C5-C6) 5/5 5/5  Push (C7) 5/5 5/5  Pull (C5-C6) 5/5 5/5  Thigh  Raise (L4-S2) 5/5 5/5  Knee Extension (L3-L4) 5/5 5/5  Knee Flexion (L5-S1) 5/5 5/5  Ankle Dorsiflexion (L4-L5) 5/5 5/5  Ankle Plantarflexion (S1-S2) 5/5 5/5        Sensation: Intact   Coordination/Complex Motor:  - Finger to Nose: Normal, negative for ataxia -Outstretched hands: Minimal tremor, while holding for prolonged period of time - Heel to shin:  Normal  - Finger tap: Normal  - Gait: slow short steps, unsteady, with support, using arm to brace in case of fall  Labs/Imaging/Neurodiagnostic studies   CBC:  Recent Labs  Lab 22-Feb-2024 0933  WBC 6.8  NEUTROABS 4.0  HGB 13.4  HCT 39.6  MCV 103.9*  PLT 257   Basic Metabolic Panel:  Lab Results  Component Value Date   NA 132 (L) 2024/02/22   K 5.0 02-22-24   CO2 20 (L) February 22, 2024   GLUCOSE 219 (H) 02-22-2024   BUN 16 02-22-24   CREATININE 1.08 (H) 02/22/24   CALCIUM  9.1 02-22-24   GFRNONAA 59 (L) 22-Feb-2024   GFRAA 66 10/10/2016   Lipid Panel:  Lab Results  Component Value Date   LDLCALC 163 (H) 12/13/2023   HgbA1c:  Lab Results  Component Value Date   HGBA1C 7.4 (H) 12/13/2023   Urine Drug Screen: No results found for: "LABOPIA", "COCAINSCRNUR", "LABBENZ", "AMPHETMU", "THCU", "LABBARB"  Alcohol Level     Component Value Date/Time   ETH 66 (H) 02/22/24 1054   INR  Lab  Results  Component Value Date   INR 1.03 12/09/2010   APTT  Lab Results  Component Value Date   APTT 29 12/09/2010   AED levels: No results found for: "PHENYTOIN", "ZONISAMIDE", "LAMOTRIGINE", "LEVETIRACETA"  EtOH 66  Ammonia 43  CT Head without contrast(Personally reviewed): 1. Unremarkable CT appearance of the brain. 2. Left mastoid effusion.  MRI Brain(Personally reviewed): 1. No acute intracranial abnormality. 2. Mild chronic small vessel ischemic disease.  ASSESSMENT   Lamyiah Rosenkrantz is a 63 y.o. female with a past hx of HTN, Asthma, DM, GERD, gallstone pancreatitis and associated chronic pancreatitis who presented with with hypoglycemia, irregular heart beat and tremors. Labs were notable for an alcohol level of 66 and Ammonia of 43.  Patient did receive Ativan x 1 which helped improve tremor symptoms.  Suspect patients tremors are related to alcohol intake and possible withdrawal.  Patient had last drink 1 AM this morning. Reports generalized weakness and concern for gait instability. From a neurological standpoint patient does not have neurological deficits, significant tremor, no past pointing, no ataxia and no clear sign of cerebellar dysfunction. She also has no myoclonus noted on physical exam. Given generalized weakness and gait instability she is at high risk for falls.  Patient's alcohol use is also order at a high risk for potential withdrawal seizures. Patient also has a worsening cough which has been ongoing for the last several days. Patient is afebrile and without leukocytosis on labs.  RECOMMENDATIONS  - Labs ordered: Vitamin B1, Vitamin E  - PT/OT Evaluation  - Consider CIWA protocol with trigger symptom Ativan - Recommended alcohol and tobacco cessation to the patient  - Consider TOC consult for alcohol use  ______________________________________________________________________  Signed, DONOVAN RAINEY, MD [facility] PGY1   NEUROHOSPITALIST  ADDENDUM Performed a face to face diagnostic evaluation.   I have reviewed the contents of history and physical exam as documented by PA/ARNP/Resident and agree with above documentation.  I have discussed and formulated the above plan as documented. Edits to  the note have been made as needed.  Impression/Key exam findings/Plan: extensive neuro exam with slight high frequency, low ampltude tremor concerning for probably EtOH withdrawal. No weakness in any muscle groups depite muscle wasting, no ataxia, no past pointing, no issues with coordination or fine motor control noted on exam. However, she has been coughing every few secs and frequently spitting mucus. Upon getting her up to walk, she has some generalized weakness and feels like her legs are going to give out. She takes small cautious steps and holding onto to wall as she does not have the strength.  I suspect that she probably has generalized weakness which is causing her hesitancy with walking. She seems high risk for falls when we made her walk. This is despite no focal neuro deficit, intact strength and coordination on exam. She feels tired and does feel like she is withdrawing from alcohol too. She was not very forth coming with her EtOH use. When we had her mother in law and her significant other step outside, she reports drinking 6 shots of vodka a day, smoking a pack in a day and half.  I would recommend PT and OT consult given noted generalized weakness and cautious steps when we attempted to get her to walk.  Martia Dalby, MD [facility] [phone]   If 7pm to 7am, please call on call as listed on AMION.

## 2024-01-25 NOTE — ED Provider Notes (Signed)
 Signed out by Dr Jeannie Milo that pt with etoh use disorder, weakness w too weak to get out of bed, tremor, initial cbg low 58 (EMS gave d50), and that neurology is consulted.   Neurology indicates tremor seems improved after bzd medication. Indicates drinking 6+ shots etoh/day. Gait unsteady/weak, not able to ambulate safely - rec admit to medicine/obs.   Pt with wheezing on recheck.  Albuterol  and atrovent neb. Cxr without pna.   Hx htn, bp currently not controlled/high.   Hospitalists consulted for admission.    Guadalupe Lee, MD 01/25/24 [phone]

## 2024-01-25 NOTE — ED Notes (Signed)
 Patient transported to CT before labs drawn.

## 2024-01-25 NOTE — ED Notes (Signed)
 Patient placed on bedpan by RN and NT

## 2024-01-25 NOTE — H&P (Signed)
 History and Physical    Melinda [name] ZOX:[id number] DOB: 1960/10/10 DOA: 01/25/2024  Patient coming from: Home.  Chief Complaint: Weakness.  HPI: Melinda [name] is a 63 y.o. female with history of diabetes mellitus type 2, hypertension, hyperlipidemia, chronic pancreatitis, history of alcohol abuse was brought to the ER after patient was found to be generally weak tremulous and slid out of the bed.  Patient states over the last one week she has been having poor appetite and has not been eating well.  Admits to drinking vodka every night.  But has been trying to cut down.  Last week she did have some abdominal discomfort which has gradually improved.  Last few days patient has been using an inhaler due to increasing cough.  This morning when she tried to get out of the bed she found it difficult because of generalized weakness and eventually slid out of the bed onto the floor.  Patient also was found to be tremulous.  Did not hit her head or lose consciousness.  EMS was called blood sugars were found to be low at 58 D50 was given and patient was brought to the ER.  ED Course: In the ER patient appeared nonfocal but generally weak.  CT head and MRI brain did not show anything acute.  Neurology on-call was consulted and felt patient's tremors could be related to alcohol and likely alcohol withdrawal.  Tremors improved after being given Ativan.  Alcohol levels were 66 and ammonia level was 43.  Creatinine 1 sodium 132.  Patient was given 1 L fluid bolus and also some nebulizer therapy for patient found to be wheezing.  Review of Systems: As per HPI, rest all negative.   Past Medical History:  Diagnosis Date   Allergy    Hay fever to pollen   Arthritis    knees   Asthma    Diabetes mellitus without complication (HCC)    diet controlled per pt.   GERD (gastroesophageal reflux disease)    Headache    sinus now, hx of migraines last 2 or 3 years ago   History of blood transfusion 09/19/2010   after  gallbladder surgery   Hypertension    Pancreatitis 2 or 3 times a week    Past Surgical History:  Procedure Laterality Date   CESAREAN SECTION     x 1   CHOLECYSTECTOMY  2012   EUS N/A 11/14/2012   Procedure: FULL UPPER ENDOSCOPIC ULTRASOUND (EUS) RADIAL;  Surgeon: Evangeline Hilts, MD;  Location: [facility];  Service: Endoscopy;  Laterality: N/A;  extended case to 90 min per Dr Kimble Pennant for definate FNA/JB   EUS N/A 07/18/2014   Procedure: UPPER ENDOSCOPIC ULTRASOUND (EUS) LINEAR;  Surgeon: Almeda Aris, MD;  Location: [facility];  Service: Endoscopy;  Laterality: N/A;   EUS N/A 10/03/2014   Procedure: UPPER ENDOSCOPIC ULTRASOUND (EUS) LINEAR;  Surgeon: Almeda Aris, MD;  Location: [facility];  Service: Endoscopy;  Laterality: N/A;     reports that she has been smoking cigarettes. She has a 5.1 pack-year smoking history. She has never used smokeless tobacco. She reports current alcohol use of about 10.0 standard drinks of alcohol per week. She reports that she does not use drugs.  Allergies  Allergen Reactions   Codeine Itching and Nausea Only    Family History  Problem Relation Age of Onset   Vision loss Mother    Liver disease Mother    Kidney disease Mother    Cancer Sister  Asthma Maternal Aunt     Prior to Admission medications   Medication Sig Start Date End Date Taking? Authorizing Provider  albuterol  (VENTOLIN  HFA) 108 (90 Base) MCG/ACT inhaler Inhale 1 puff into the lungs as needed for wheezing or shortness of breath. 12/13/23  Yes Amadeo June, MD  glipiZIDE  (GLUCOTROL ) 5 MG tablet Take 1 tablet (5 mg total) by mouth daily before breakfast. 12/18/23  Yes Amadeo June, MD  losartan  (COZAAR ) 50 MG tablet Take 1 tablet (50 mg total) by mouth daily. 12/13/23  Yes Aguiar, Rafaela, MD  meclizine (ANTIVERT) 25 MG tablet Take 25 mg by mouth as needed for dizziness.   Yes [provider]  metoprolol  tartrate (LOPRESSOR ) 25 MG tablet Take 1 tablet (25 mg  total) by mouth 2 (two) times daily. 12/13/23  Yes Amadeo June, MD  Nutritional Supplements (NUTRITIONAL SUPPLEMENT PO) Take by mouth. Sea moss, "better lungs", and "my biotin" brand biotin   Yes [provider]  rosuvastatin  (CRESTOR ) 10 MG tablet Take 1 tablet (10 mg total) by mouth daily. 12/15/23  Yes Amadeo June, MD  vitamin B-12 (CYANOCOBALAMIN) 500 MCG tablet Take 500 mcg by mouth daily.   Yes [provider]  vitamin D3 (CHOLECALCIFEROL) 25 MCG tablet Take 1,000 Units by mouth daily.   Yes [provider]    Physical Exam: Constitutional: Moderately built and nourished. Vitals:   01/25/24 1400 01/25/24 1749 01/25/24 1930 01/25/24 2100  BP: (!) 134/106 (!) 152/116 (!) 143/80 (!) 168/74  Pulse: 82 87 93 89  Resp: (!) 23 (!) 21 (!) 21 18  Temp:  97.6 F (36.4 C)    TempSrc:      SpO2: 96% 100% 95% 99%  Weight:      Height:       Eyes: Anicteric no pallor. ENMT: No discharge from the ears eyes nose or mouth. Neck: No mass felt.  No neck rigidity. Respiratory: No rhonchi or crepitations. Cardiovascular: S1 and S2 heard. Abdomen: Soft nontender bowel sound present. Musculoskeletal: No edema. Skin: No rash. Neurologic: Alert awake oriented time place and person.  Moves all extremities. Psychiatric: Appears normal.  Normal affect.   Labs on Admission: I have personally reviewed following labs and imaging studies  CBC: Recent Labs  Lab 01/25/24 0933  WBC 6.8  NEUTROABS 4.0  HGB 13.4  HCT 39.6  MCV 103.9*  PLT 257   Basic Metabolic Panel: Recent Labs  Lab 01/25/24 0933  NA 132*  K 5.0  CL 99  CO2 20*  GLUCOSE 219*  BUN 16  CREATININE 1.08*  CALCIUM  9.1  MG 2.2   GFR: Estimated Creatinine Clearance: 48.5 mL/min (A) (by C-G formula based on SCr of 1.08 mg/dL (H)). Liver Function Tests: Recent Labs  Lab 01/25/24 0933  AST 46*  ALT 37  ALKPHOS 80  BILITOT 0.7  PROT 7.5  ALBUMIN 3.7   No results for input(s): "LIPASE",  "AMYLASE" in the last 168 hours. Recent Labs  Lab 01/25/24 1054  AMMONIA 43*   Coagulation Profile: No results for input(s): "INR", "PROTIME" in the last 168 hours. Cardiac Enzymes: No results for input(s): "CKTOTAL", "CKMB", "CKMBINDEX", "TROPONINI" in the last 168 hours. BNP (last 3 results) No results for input(s): "PROBNP" in the last 8760 hours. HbA1C: No results for input(s): "HGBA1C" in the last 72 hours. CBG: Recent Labs  Lab 01/25/24 0927 01/25/24 1037  GLUCAP 180* 159*   Lipid Profile: No results for input(s): "CHOL", "HDL", "LDLCALC", "TRIG", "CHOLHDL", "LDLDIRECT" in the last  72 hours. Thyroid Function Tests: Recent Labs    01/25/24 1322  TSH 2.478   Anemia Panel: Recent Labs    01/25/24 1322  VITAMINB12 779  FOLATE 9.4   Urine analysis:    Component Value Date/Time   COLORURINE AMBER (A) 11/08/2012 1909   APPEARANCEUR CLEAR 11/08/2012 1909   LABSPEC 1.025 10/10/2016 1152   PHURINE 5.5 10/10/2016 1152   GLUCOSEU NEGATIVE 10/10/2016 1152   HGBUR NEGATIVE 10/10/2016 1152   BILIRUBINUR NEGATIVE 10/10/2016 1152   KETONESUR NEGATIVE 10/10/2016 1152   PROTEINUR NEGATIVE 10/10/2016 1152   UROBILINOGEN 0.2 10/10/2016 1152   NITRITE NEGATIVE 10/10/2016 1152   LEUKOCYTESUR NEGATIVE 10/10/2016 1152   Sepsis Labs: @LABRCNTIP (procalcitonin:4,lacticidven:4) ) Recent Results (from the past 240 hours)  Resp panel by RT-PCR (RSV, Flu A&B, Covid) Anterior Nasal Swab     Status: None   Collection Time: 01/25/24 10:31 AM   Specimen: Anterior Nasal Swab  Result Value Ref Range Status   SARS Coronavirus 2 by RT PCR NEGATIVE NEGATIVE Final   Influenza A by PCR NEGATIVE NEGATIVE Final   Influenza B by PCR NEGATIVE NEGATIVE Final    Comment: (NOTE) The Xpert Xpress SARS-CoV-2/FLU/RSV plus assay is intended as an aid in the diagnosis of influenza from Nasopharyngeal swab specimens and should not be used as a sole basis for treatment. Nasal washings and aspirates  are unacceptable for Xpert Xpress SARS-CoV-2/FLU/RSV testing.  Fact Sheet for Patients: BloggerCourse.com  Fact Sheet for Healthcare Providers: SeriousBroker.it  This test is not yet approved or cleared by the [place]  FDA and has been authorized for detection and/or diagnosis of SARS-CoV-2 by FDA under an Emergency Use Authorization (EUA). This EUA will remain in effect (meaning this test can be used) for the duration of the COVID-19 declaration under Section 564(b)(1) of the Act, 21 U.S.C. section 360bbb-3(b)(1), unless the authorization is terminated or revoked.     Resp Syncytial Virus by PCR NEGATIVE NEGATIVE Final    Comment: (NOTE) Fact Sheet for Patients: BloggerCourse.com  Fact Sheet for Healthcare Providers: SeriousBroker.it  This test is not yet approved or cleared by the [place]  FDA and has been authorized for detection and/or diagnosis of SARS-CoV-2 by FDA under an Emergency Use Authorization (EUA). This EUA will remain in effect (meaning this test can be used) for the duration of the COVID-19 declaration under Section 564(b)(1) of the Act, 21 U.S.C. section 360bbb-3(b)(1), unless the authorization is terminated or revoked.  Performed at [facility], [address], [place], [place] [address]      Radiological Exams on Admission: DG Chest Port 1 View Result Date: 01/25/2024 CLINICAL DATA:  cough EXAM: PORTABLE CHEST - 1 VIEW COMPARISON:  October 28, 2014, December 09, 2010 FINDINGS: No focal airspace consolidation, pleural effusion, or pneumothorax. Calcified granuloma in the left upper lung zone. Mild cardiomegaly. Aortic atherosclerosis. No acute fracture or destructive lesion. Multilevel thoracic osteophytosis. IMPRESSION: Mild cardiomegaly.  Otherwise, no acute cardiopulmonary abnormality. Electronically Signed   By: Rance Burrows M.D.   On:  01/25/2024 17:14   MR BRAIN WO CONTRAST Result Date: 01/25/2024 CLINICAL DATA:  New onset tremor.  Concern for cerebellar pathology. EXAM: MRI HEAD WITHOUT CONTRAST TECHNIQUE: Multiplanar, multiecho pulse sequences of the brain and surrounding structures were obtained without intravenous contrast. COMPARISON:  Head CT 01/25/2024 FINDINGS: The study is mildly motion degraded. Brain: There is no evidence of an acute infarct, intracranial hemorrhage, mass, midline shift, or extra-axial fluid collection. Small T2 hyperintensities in the cerebral  white matter and pons are nonspecific but compatible with mild chronic small vessel ischemic disease. The cerebellum is normal in signal. Mild cerebral atrophy is within normal limits for age. Vascular: Major intracranial vascular flow voids are preserved. Skull and upper cervical spine: Unremarkable bone marrow signal. Sinuses/Orbits: Unremarkable orbits. Minimal mucosal thickening in the paranasal sinuses. Moderate-sized left mastoid effusion. Other: None. IMPRESSION: 1. No acute intracranial abnormality. 2. Mild chronic small vessel ischemic disease. Electronically Signed   By: Aundra Lee M.D.   On: 01/25/2024 14:48   CT Head Wo Contrast Result Date: 01/25/2024 CLINICAL DATA:  New onset tremor. EXAM: CT HEAD WITHOUT CONTRAST TECHNIQUE: Contiguous axial images were obtained from the base of the skull through the vertex without intravenous contrast. RADIATION DOSE REDUCTION: This exam was performed according to the departmental dose-optimization program which includes automated exposure control, adjustment of the mA and/or kV according to patient size and/or use of iterative reconstruction technique. COMPARISON:  Head CT 07/31/2006 FINDINGS: Brain: There is no evidence of an acute infarct, intracranial hemorrhage, mass, midline shift, or extra-axial fluid collection. Cerebral volume is within normal limits for age. The ventricles are normal in size. Vascular: Calcified  atherosclerosis at the skull base. No hyperdense vessel. Skull: No fracture or suspicious lesion. Sinuses/Orbits: Mild mucosal thickening in the maxillary sinuses. Moderate-sized left mastoid effusion. Unremarkable orbits. Other: None. IMPRESSION: 1. Unremarkable CT appearance of the brain. 2. Left mastoid effusion. Electronically Signed   By: Aundra Lee M.D.   On: 01/25/2024 12:08    EKG: Independently reviewed.  Sinus rhythm with PVCs.  Assessment/Plan Principal Problem:   Weakness Active Problems:   Hypertension   History of chronic pancreatitis   Tobacco dependence   Hyperlipidemia   Asthma, chronic   Vitamin B12 deficiency   Alcohol abuse   Hypoglycemia    Generalized weakness likely from deconditioning.  Appreciate neurology consult.  MRI brain was unremarkable.  Will consult physical therapy. Acute renal failure with hyponatremia could be from poor oral intake.  Alcohol also could be contributing.  ER physician had already given 1 L fluid.  Will closely monitor. Hypoglycemia likely from poor oral intake and patient using glipizide  in setting of renal failure.  Hold glipizide  and will check CBGs every 4 hourly for now until blood sugars are stable.  Last hemoglobin A1c was 7.4 last month. Elevated troponin denies any chest pain.  Will check 2D echo.   Alcohol abuse on CIWA protocol.  Thiamine.  Advised about quitting. Chronic pancreatitis recently has been having increasing abdominal pain but has since improved.  CT abdomen is pending.  Advance diet as tolerated.  Has previously required pseudocyst aspiration. Hypertension on losartan  and metoprolol .  May have to hold losartan  if creatinine does not improve.  Patient did receive 1 L fluid bolus in the ER. Hyperlipidemia on statins.  Check CK levels. Macrocytosis could be from alcohol abuse.  B12 folate levels are normal. History of asthma initially was wheezing.  Will keep patient on nebulizer therapy for now. Frequent PVCs will  closely monitor electrolytes.  Follow 2D echo.  Since patient has generalized weakness with dehydration hypoglycemia will need close monitoring and more than 2 midnight stay.   DVT prophylaxis: Lovenox. Code Status: Full code. Family Communication: Discussed with patient. Disposition Plan: Medical floor. Consults called: Neurology.  Physical therapy. Admission status: Observation.

## 2024-01-25 NOTE — ED Triage Notes (Addendum)
 Patient from home by [facility] for new onset bigeminy, tremor, and hypoglycemia. Slipped out of bed this am. FD found CBG to be 58, got her to 64. New onset of tremor and new onset of bigeminy. Normotensive. PVC nonperfusing, GCS 15. No known hx. 25g dextrose given en route. 20 LAC. Repeat 12 lead-same. Patient with hx of DM2, takes glipizide  at home, no use of insulin. Patient also endorses fatigue and weakness that is not new in onset.

## 2024-01-25 NOTE — BH Assessment (Signed)
 Patient was deferred to IRIS for a telepsych assessment. The assigned care coordinator will provide updates regarding the scheduling of the assessment. IRIS coordinator can be reached at [phone] for further information on the timing of the telepsych evaluation.

## 2024-01-25 NOTE — ED Notes (Signed)
 PT is c/o needing her albuterol  inhaler. Physician Ephraim Hash) has been notified.

## 2024-01-25 NOTE — ED Notes (Signed)
 Called floor and notified them that PT was on the way up.

## 2024-01-25 NOTE — ED Notes (Signed)
Transport notified  

## 2024-01-26 ENCOUNTER — Telehealth: Payer: Self-pay

## 2024-01-26 ENCOUNTER — Observation Stay (HOSPITAL_COMMUNITY)

## 2024-01-26 DIAGNOSIS — E538 Deficiency of other specified B group vitamins: Secondary | ICD-10-CM | POA: Diagnosis present

## 2024-01-26 DIAGNOSIS — K219 Gastro-esophageal reflux disease without esophagitis: Secondary | ICD-10-CM | POA: Diagnosis present

## 2024-01-26 DIAGNOSIS — E871 Hypo-osmolality and hyponatremia: Secondary | ICD-10-CM | POA: Diagnosis present

## 2024-01-26 DIAGNOSIS — J45909 Unspecified asthma, uncomplicated: Secondary | ICD-10-CM | POA: Diagnosis present

## 2024-01-26 DIAGNOSIS — W06XXXA Fall from bed, initial encounter: Secondary | ICD-10-CM | POA: Diagnosis present

## 2024-01-26 DIAGNOSIS — K861 Other chronic pancreatitis: Secondary | ICD-10-CM | POA: Diagnosis present

## 2024-01-26 DIAGNOSIS — I10 Essential (primary) hypertension: Secondary | ICD-10-CM | POA: Diagnosis present

## 2024-01-26 DIAGNOSIS — R32 Unspecified urinary incontinence: Secondary | ICD-10-CM | POA: Diagnosis present

## 2024-01-26 DIAGNOSIS — Z79899 Other long term (current) drug therapy: Secondary | ICD-10-CM | POA: Diagnosis not present

## 2024-01-26 DIAGNOSIS — I493 Ventricular premature depolarization: Secondary | ICD-10-CM | POA: Diagnosis present

## 2024-01-26 DIAGNOSIS — Z841 Family history of disorders of kidney and ureter: Secondary | ICD-10-CM | POA: Diagnosis not present

## 2024-01-26 DIAGNOSIS — E11649 Type 2 diabetes mellitus with hypoglycemia without coma: Secondary | ICD-10-CM | POA: Diagnosis present

## 2024-01-26 DIAGNOSIS — Z7984 Long term (current) use of oral hypoglycemic drugs: Secondary | ICD-10-CM | POA: Diagnosis not present

## 2024-01-26 DIAGNOSIS — E785 Hyperlipidemia, unspecified: Secondary | ICD-10-CM | POA: Diagnosis present

## 2024-01-26 DIAGNOSIS — F10139 Alcohol abuse with withdrawal, unspecified: Secondary | ICD-10-CM | POA: Diagnosis present

## 2024-01-26 DIAGNOSIS — E860 Dehydration: Secondary | ICD-10-CM | POA: Diagnosis present

## 2024-01-26 DIAGNOSIS — N179 Acute kidney failure, unspecified: Secondary | ICD-10-CM | POA: Diagnosis present

## 2024-01-26 DIAGNOSIS — F17210 Nicotine dependence, cigarettes, uncomplicated: Secondary | ICD-10-CM | POA: Diagnosis present

## 2024-01-26 DIAGNOSIS — R7989 Other specified abnormal findings of blood chemistry: Secondary | ICD-10-CM | POA: Diagnosis present

## 2024-01-26 DIAGNOSIS — R531 Weakness: Secondary | ICD-10-CM | POA: Diagnosis present

## 2024-01-26 DIAGNOSIS — Z885 Allergy status to narcotic agent status: Secondary | ICD-10-CM | POA: Diagnosis not present

## 2024-01-26 DIAGNOSIS — Z825 Family history of asthma and other chronic lower respiratory diseases: Secondary | ICD-10-CM | POA: Diagnosis not present

## 2024-01-26 DIAGNOSIS — D7589 Other specified diseases of blood and blood-forming organs: Secondary | ICD-10-CM | POA: Diagnosis present

## 2024-01-26 DIAGNOSIS — Z9049 Acquired absence of other specified parts of digestive tract: Secondary | ICD-10-CM | POA: Diagnosis not present

## 2024-01-26 DIAGNOSIS — R251 Tremor, unspecified: Secondary | ICD-10-CM | POA: Diagnosis present

## 2024-01-26 DIAGNOSIS — Y903 Blood alcohol level of 60-79 mg/100 ml: Secondary | ICD-10-CM | POA: Diagnosis present

## 2024-01-26 DIAGNOSIS — Z1152 Encounter for screening for COVID-19: Secondary | ICD-10-CM | POA: Diagnosis not present

## 2024-01-26 LAB — HEPATIC FUNCTION PANEL
ALT: 31 U/L (ref 0–44)
AST: 38 U/L (ref 15–41)
Albumin: 3.5 g/dL (ref 3.5–5.0)
Alkaline Phosphatase: 72 U/L (ref 38–126)
Bilirubin, Direct: 0.2 mg/dL (ref 0.0–0.2)
Indirect Bilirubin: 0.6 mg/dL (ref 0.3–0.9)
Total Bilirubin: 0.8 mg/dL (ref 0.0–1.2)
Total Protein: 6.8 g/dL (ref 6.5–8.1)

## 2024-01-26 LAB — CBC
HCT: 37 % (ref 36.0–46.0)
HCT: 37.9 % (ref 36.0–46.0)
Hemoglobin: 12.6 g/dL (ref 12.0–15.0)
Hemoglobin: 12.9 g/dL (ref 12.0–15.0)
MCH: 35.1 pg — ABNORMAL HIGH (ref 26.0–34.0)
MCH: 35.2 pg — ABNORMAL HIGH (ref 26.0–34.0)
MCHC: 34 g/dL (ref 30.0–36.0)
MCHC: 34.1 g/dL (ref 30.0–36.0)
MCV: 103.1 fL — ABNORMAL HIGH (ref 80.0–100.0)
MCV: 103.6 fL — ABNORMAL HIGH (ref 80.0–100.0)
Platelets: 230 10*3/uL (ref 150–400)
Platelets: 245 10*3/uL (ref 150–400)
RBC: 3.59 MIL/uL — ABNORMAL LOW (ref 3.87–5.11)
RBC: 3.66 MIL/uL — ABNORMAL LOW (ref 3.87–5.11)
RDW: 12.1 % (ref 11.5–15.5)
RDW: 12.2 % (ref 11.5–15.5)
WBC: 7.8 10*3/uL (ref 4.0–10.5)
WBC: 7.9 10*3/uL (ref 4.0–10.5)
nRBC: 0 % (ref 0.0–0.2)
nRBC: 0 % (ref 0.0–0.2)

## 2024-01-26 LAB — ECHOCARDIOGRAM COMPLETE
AR max vel: 1.47 cm2
AV Area VTI: 1.8 cm2
AV Area mean vel: 1.76 cm2
AV Mean grad: 7 mmHg
AV Peak grad: 14.1 mmHg
Ao pk vel: 1.88 m/s
Area-P 1/2: 4.17 cm2
Height: 62 in
S' Lateral: 2.1 cm
Weight: 2359.8 [oz_av]

## 2024-01-26 LAB — HIV ANTIBODY (ROUTINE TESTING W REFLEX): HIV Screen 4th Generation wRfx: NONREACTIVE

## 2024-01-26 LAB — BASIC METABOLIC PANEL WITH GFR
Anion gap: 12 (ref 5–15)
BUN: 15 mg/dL (ref 6–20)
CO2: 24 mmol/L (ref 22–32)
Calcium: 9.2 mg/dL (ref 8.9–10.3)
Chloride: 98 mmol/L (ref 98–111)
Creatinine, Ser: 0.98 mg/dL (ref 0.44–1.00)
GFR, Estimated: >60 mL/min (ref >60)
Glucose, Bld: 119 mg/dL — ABNORMAL HIGH (ref 70–99)
Potassium: 4.1 mmol/L (ref 3.5–5.1)
Sodium: 134 mmol/L — ABNORMAL LOW (ref 135–145)

## 2024-01-26 LAB — CREATININE, SERUM
Creatinine, Ser: 0.99 mg/dL (ref 0.44–1.00)
GFR, Estimated: >60 mL/min (ref >60)

## 2024-01-26 LAB — CK: Total CK: 139 U/L (ref 38–234)

## 2024-01-26 LAB — HEMOGLOBIN A1C
Hgb A1c MFr Bld: 6.6 % — ABNORMAL HIGH (ref 4.8–5.6)
Mean Plasma Glucose: 142.72 mg/dL

## 2024-01-26 LAB — GLUCOSE, CAPILLARY
Glucose-Capillary: 112 mg/dL — ABNORMAL HIGH (ref 70–99)
Glucose-Capillary: 124 mg/dL — ABNORMAL HIGH (ref 70–99)
Glucose-Capillary: 213 mg/dL — ABNORMAL HIGH (ref 70–99)
Glucose-Capillary: 168 mg/dL — ABNORMAL HIGH (ref 70–99)

## 2024-01-26 MED ORDER — PANCRELIPASE (LIP-PROT-AMYL) 36000-114000 UNITS PO CPEP
36000.0000 [IU] | ORAL_CAPSULE | Freq: Three times a day (TID) | ORAL | Status: DC
  Administered 2024-01-26 – 2024-01-27 (×3): 36000 [IU] via ORAL
  Filled 2024-01-26 (×3): qty 1

## 2024-01-26 MED ORDER — THIAMINE HCL 100 MG/ML IJ SOLN: 100.0000 mg | Freq: Every day | INTRAMUSCULAR | Status: DC

## 2024-01-26 MED ORDER — IPRATROPIUM-ALBUTEROL 0.5-2.5 (3) MG/3ML IN SOLN
3.0000 mL | RESPIRATORY_TRACT | Status: DC | PRN
  Administered 2024-01-26: 3 mL via RESPIRATORY_TRACT
  Filled 2024-01-26: qty 3

## 2024-01-26 MED ORDER — INSULIN ASPART 100 UNIT/ML IJ SOLN
0.0000 [IU] | Freq: Three times a day (TID) | INTRAMUSCULAR | Status: DC
  Administered 2024-01-26: 5 [IU] via SUBCUTANEOUS
  Administered 2024-01-27: 3 [IU] via SUBCUTANEOUS

## 2024-01-26 MED ORDER — IBUPROFEN 200 MG PO TABS
400.0000 mg | ORAL_TABLET | Freq: Once | ORAL | Status: AC | PRN
  Administered 2024-01-27: 400 mg via ORAL
  Filled 2024-01-26: qty 2

## 2024-01-26 MED ORDER — THIAMINE HCL 100 MG/ML IJ SOLN
500.0000 mg | Freq: Three times a day (TID) | INTRAVENOUS | Status: DC
  Administered 2024-01-26 – 2024-01-27 (×3): 500 mg via INTRAVENOUS
  Filled 2024-01-26 (×3): qty 5
  Filled 2024-01-26: qty 500
  Filled 2024-01-26: qty 5

## 2024-01-26 MED ORDER — THIAMINE MONONITRATE 100 MG PO TABS: 100.0000 mg | ORAL_TABLET | Freq: Every day | ORAL | Status: DC

## 2024-01-26 MED ORDER — THIAMINE HCL 100 MG/ML IJ SOLN
500.0000 mg | Freq: Once | INTRAVENOUS | Status: AC
  Administered 2024-01-26: 500 mg via INTRAVENOUS
  Filled 2024-01-26: qty 5

## 2024-01-26 MED ORDER — THIAMINE HCL 100 MG/ML IJ SOLN: 250.0000 mg | INTRAVENOUS | Status: DC

## 2024-01-26 NOTE — Progress Notes (Signed)
 Progress Note   Patient: Melinda [name] NWG:[id number] DOB: [birth year] DOA: 01/25/2024     0 DOS: the patient was seen and examined on 01/26/2024   Brief hospital course: 63 year old woman with PMH of T2DM, HTN, HLD, chronic pancreatitis, alcohol use disorder who presented to the ED due to weakness.  Admits to drinking 2-3 drinks nightly.  Complains of balance issues on and off for 3 to 4 months, poor p.o. intake due to abdominal discomfort, diarrhea, low energy on and off for 1 month.  Patient admitted for further evaluation.  Assessment and Plan:  Weakness Likely due to deconditioning and possible malnutrition in the setting of chronic pancreatitis, poor p.o. intake, alcohol use disorder. Patient has been seen by neurology. - PT - Nutritional support. - Wernicke's dose thiamine .  Chronic pancreatitis Noted on CT. Patient reports bloating with p.o. intake.  She also has symptoms of malabsorption. She has never been on Creon. - Start Creon.  Alcohol use disorder Patient admits to drinking 2-3 alcoholic drinks every night. States this makes her feel better. Blood alcohol level 66 on presentation. - Advised cessation. - CIWA protocol.  HTN Patient is on losartan , metoprolol  at home. - Home medications resumed.  T2DM Patient is on glipizide  at home. HbA1c: 6.6 - Hold home oral hypoglycemics. - Sliding scale insulin.  Hyperlipidemia Is on Crestor  at home. CKD: 139. - Continue home Crestor .     Subjective: Patient complains of low energy on and off for a month and states it was worse yesterday, prompting her visit to the emergency department. She has had a poor appetite due to her pancreatitis.  She has never been on Creon. She complains of balance issues for 3 to 4 months.  No history of falls. She denies memory issues.  Admits to urinary incontinence on and off since 2014.  Physical Exam: Vitals:   01/26/24 0527 01/26/24 0805 01/26/24 0847 01/26/24 1109  BP: (!) 148/76   (!) 147/66 (!) 143/75  Pulse: 85  93 94  Resp:   18   Temp:   98.6 F (37 C)   TempSrc:   Oral   SpO2:  97% 94%   Weight:      Height:       Physical Exam   General: Alert, oriented X3  Eyes: Pupils equal, reactive  Oral cavity: moist mucous membranes  Head: Atraumatic, parotidomegaly + Neck: supple  Chest: clear to auscultation. No crackles, no wheezes  CVS: S1,S2 RRR. No murmurs  Abd: No distention, soft, non-tender. No masses palpable  Extr: No edema   MSK: No joint deformities or swelling  Neurological: Grossly intact.    Data Reviewed:     Latest Ref Rng & Units 01/26/2024    1:32 AM 01/25/2024   11:45 PM 01/25/2024    9:33 AM  CBC  WBC 4.0 - 10.5 K/uL 7.9  7.8  6.8   Hemoglobin 12.0 - 15.0 g/dL 57.8  46.9  62.9   Hematocrit 36.0 - 46.0 % 37.0  37.9  39.6   Platelets 150 - 400 K/uL 230  245  257       Latest Ref Rng & Units 01/26/2024    1:32 AM 01/25/2024   11:45 PM 01/25/2024    9:33 AM  BMP  Glucose 70 - 99 mg/dL 528   413   BUN 6 - 20 mg/dL 15   16   Creatinine 2.44 - 1.00 mg/dL 0.10  2.72  5.36   Sodium 135 - 145 mmol/L  134   132   Potassium 3.5 - 5.1 mmol/L 4.1   5.0   Chloride 98 - 111 mmol/L 98   99   CO2 22 - 32 mmol/L 24   20   Calcium  8.9 - 10.3 mg/dL 9.2   9.1      Family Communication: n/a  Disposition: Status is: Observation The patient remains OBS appropriate and will d/c before 2 midnights.  Planned Discharge Destination: Home with Home Health    Time spent: 50 minutes  Author: MDALA-GAUSI, Igor Melinda AGATHA, MD 01/26/2024 12:58 PM  For on call review [email or web address].

## 2024-01-26 NOTE — Plan of Care (Signed)

## 2024-01-26 NOTE — Telephone Encounter (Signed)
 Prescription Request  01/26/2024  LOV: 12/13/23  What is the name of the medication or equipment? rosuvastatin  (CRESTOR ) 10 MG tablet [[id number]]   Have you contacted your pharmacy to request a refill? Yes   Which pharmacy would you like this sent to? [facility] [address] [place], [place] [address] Fax# [phone]   Patient notified that their request is being sent to the clinical staff for review and that they should receive a response within 2 business days.   Please advise at [facility] [phone]  Prescription Request  01/26/2024  LOV: V3/26/25  What is the name of the medication or equipment? losartan  (COZAAR ) 50 MG tablet [[id number]]   Have you contacted your pharmacy to request a refill? Yes   Which pharmacy would you like this sent to? [facility] [address] [place], [place] [address] Fax# [phone]     Patient notified that their request is being sent to the clinical staff for review and that they should receive a response within 2 business days.   Please advise at [facility] [phone]  Prescription Request  01/26/2024  LOV: 12/13/23  What is the name of the medication or equipment? metoprolol  tartrate (LOPRESSOR ) 25 MG tablet [[id number]]   Have you contacted your pharmacy to request a refill? Yes   Which pharmacy would you like this sent to? [facility] - [place], [place] - [address] [place] [address] Phone: [phone] Fax: [phone]  Patient notified that their request is being sent to the clinical staff for review and that they should receive a response within 2 business days.   Please advise at [facility] [phone]  Prescription Request  01/26/2024  LOV: 12/13/23  What is the name of the medication or equipment? glipiZIDE  (GLUCOTROL ) 5 MG tablet [[id number]]   Have you contacted your pharmacy to request a refill? Yes   Which pharmacy would you like this sent to?  [facility] - [place], [place] - [address] [place] [address] Phone: [phone] Fax: [phone]  Patient notified that their request is being sent to the clinical staff for review and that they should receive a response within 2 business days.   Please advise at [facility] [phone]

## 2024-01-26 NOTE — Progress Notes (Signed)
 Mobility Specialist: Progress Note   01/26/24 1444  Mobility  Activity Ambulated with assistance in hallway  Level of Assistance Standby assist, set-up cues, supervision of patient - no hands on  Assistive Device Front wheel walker  Distance Ambulated (ft) 200 ft  Activity Response Tolerated well  Mobility Referral Yes  Mobility visit 1 Mobility  Mobility Specialist Start Time (ACUTE ONLY) 1320  Mobility Specialist Stop Time (ACUTE ONLY) 1327  Mobility Specialist Time Calculation (min) (ACUTE ONLY) 7 min    Pt agreeable to mobility session - received in bed. SV throughout. C/o fatigue. Returned to room without fault. Left in bed with all needs met, call bell in reach.   Deloria Fetch Mobility Specialist Please contact via SecureChat or Rehab office at [phone]

## 2024-01-26 NOTE — Progress Notes (Signed)
 Transition of Care [facility]) - Inpatient Brief Assessment   Patient Details  Name: Melinda [name] MRN: [id number] Date of Birth: 02-Mar-1961  Transition of Care [facility]) CM/SW Contact:    Dane Dung, RN Phone Number: 01/26/2024, 2:09 PM   Clinical Narrative: CM met with the patient at the bedside to discuss TOC needs.  The patient admitted for weakness and history of ETOH and pancreatitis.  Patient drinks 2-3 drinks of ETOH per day.  Resources provided in the AVS for the patient.  I was unable to offer home health services to the patient due to patient's insurance being out of network with home health agencies.  Patient is aware and referral for Outpatient therapy rehabilitation services was placed.  Patient states that she would need to coordinate transportation with a friend since her husband is likely out of town due to his truck driving occupation.  I called [employer] and requested delivery of RW to the patient's bedside today.  No other TOC needs at this time.   Transition of Care Asessment: Insurance and Status: (P) Insurance coverage has been reviewed Patient has primary care physician: (P) Yes Home environment has been reviewed: (P) from home with spouse Prior level of function:: (P) Independent Prior/Current Home Services: (P) No current home services Social Drivers of Health Review: (P) SDOH reviewed interventions complete Readmission risk has been reviewed: (P) Yes Transition of care needs: (P) transition of care needs identified, TOC will continue to follow

## 2024-01-26 NOTE — Evaluation (Signed)
 Physical Therapy Evaluation Patient Details Name: Melinda [name] MRN: [id number] DOB: 1960/09/24 Today's Date: 01/26/2024  History of Present Illness  63 yo female admitted 5/8 after fall out of bed with new onset bigeminy, tremor, and hypoglycemia. PMHx: HTN, HLD, ETOH abuse, asthma, T2DM  Clinical Impression  Pt pleasant and reports decreased tremor from admission but still feeling weak and unsteady. Pt able to perform transfers with supervision and reliance on RW for gait. PT limited by fatigue and will benefit from acute therapy to maximize balance, activity tolerance and safety to decrease burden of care.        If plan is discharge home, recommend the following: Assistance with cooking/housework;Assist for transportation;Supervision due to cognitive status   Can travel by private vehicle        Equipment Recommendations Rolling walker (2 wheels)  Recommendations for Other Services       Functional Status Assessment Patient has had a recent decline in their functional status and demonstrates the ability to make significant improvements in function in a reasonable and predictable amount of time.     Precautions / Restrictions Precautions Precautions: Fall Recall of Precautions/Restrictions: Intact      Mobility  Bed Mobility Overal bed mobility: Modified Independent             General bed mobility comments: incresed time, HOB 25 degrees    Transfers Overall transfer level: Needs assistance   Transfers: Sit to/from Stand Sit to Stand: Supervision           General transfer comment: supervision for safety and IV. pt initially stating dizziness with BP 145/75 (95).    Ambulation/Gait Ambulation/Gait assistance: Supervision Gait Distance (Feet): 180 Feet Assistive device: Rolling walker (2 wheels) Gait Pattern/deviations: Step-through pattern, Decreased stride length   Gait velocity interpretation: 1.31 - 2.62 ft/sec, indicative of limited community  ambulator   General Gait Details: cues for posture and proximity in RW, limited by fatigue  Stairs            Wheelchair Mobility     Tilt Bed    Modified Rankin (Stroke Patients Only)       Balance Overall balance assessment: Needs assistance Sitting-balance support: No upper extremity supported, Feet supported Sitting balance-Leahy Scale: Good Sitting balance - Comments: no assist EOB   Standing balance support: Bilateral upper extremity supported, No upper extremity supported Standing balance-Leahy Scale: Fair Standing balance comment: able to static stand without support, RW for gait                             Pertinent Vitals/Pain Pain Assessment Pain Assessment: No/denies pain    Home Living Family/patient expects to be discharged to:: Private residence Living Arrangements: Spouse/significant other Available Help at Discharge: Family Type of Home: House Home Access: Stairs to enter   Secretary/administrator of Steps: 3   Home Layout: One level Home Equipment: None      Prior Function Prior Level of Function : Independent/Modified Independent             Mobility Comments: pt normally independent without AD ADLs Comments: independent     Extremity/Trunk Assessment   Upper Extremity Assessment Upper Extremity Assessment: Overall WFL for tasks assessed    Lower Extremity Assessment Lower Extremity Assessment: Overall WFL for tasks assessed    Cervical / Trunk Assessment Cervical / Trunk Assessment: Normal  Communication   Communication Communication: No apparent difficulties    Cognition Arousal:  Alert Behavior During Therapy: Flat affect   PT - Cognitive impairments: Problem solving                       PT - Cognition Comments: slow processing Following commands: Intact       Cueing Cueing Techniques: Verbal cues     General Comments      Exercises     Assessment/Plan    PT Assessment Patient  needs continued PT services  PT Problem List Decreased activity tolerance;Decreased balance;Decreased mobility;Decreased knowledge of use of DME       PT Treatment Interventions DME instruction;Gait training;Stair training;Functional mobility training;Therapeutic activities;Patient/family education;Cognitive remediation    PT Goals (Current goals can be found in the Care Plan section)  Acute Rehab PT Goals Patient Stated Goal: return home to garden PT Goal Formulation: With patient/family Time For Goal Achievement: 02/09/24 Potential to Achieve Goals: Good    Frequency Min 2X/week     Co-evaluation               AM-PAC PT "6 Clicks" Mobility  Outcome Measure Help needed turning from your back to your side while in a flat bed without using bedrails?: None Help needed moving from lying on your back to sitting on the side of a flat bed without using bedrails?: A Little Help needed moving to and from a bed to a chair (including a wheelchair)?: A Little Help needed standing up from a chair using your arms (e.g., wheelchair or bedside chair)?: A Little Help needed to walk in hospital room?: A Little Help needed climbing 3-5 steps with a railing? : A Little 6 Click Score: 19    End of Session Equipment Utilized During Treatment: Gait belt Activity Tolerance: Patient tolerated treatment well Patient left: in chair;with call bell/phone within reach;with chair alarm set;with family/visitor present Nurse Communication: Mobility status PT Visit Diagnosis: Other abnormalities of gait and mobility (R26.89);Difficulty in walking, not elsewhere classified (R26.2)    Time: 1610-9604 PT Time Calculation (min) (ACUTE ONLY): 17 min   Charges:   PT Evaluation $PT Eval Low Complexity: 1 Low   PT General Charges $$ ACUTE PT VISIT: 1 Visit         Annis Baseman, PT Acute Rehabilitation Services Office: [phone]   Jackey Mary Evangelyn Crouse 01/26/2024, 10:37 AM

## 2024-01-26 NOTE — Progress Notes (Signed)
 She had no deficit on Neuro exam yesterday. Low suspicion for primary neurologic etiology of her generalized weakness.  Neurology will signoff. Please feel free to contact us  with any questions or concerns.  Andreah Goheen [facility]

## 2024-01-26 NOTE — BH Assessment (Signed)
 05/09 @0507 : IRIS deferred back to in house provider due to patient being under medical admission.

## 2024-01-27 DIAGNOSIS — R531 Weakness: Secondary | ICD-10-CM | POA: Diagnosis not present

## 2024-01-27 LAB — GLUCOSE, CAPILLARY
Glucose-Capillary: 151 mg/dL — ABNORMAL HIGH (ref 70–99)
Glucose-Capillary: 181 mg/dL — ABNORMAL HIGH (ref 70–99)

## 2024-01-27 LAB — BASIC METABOLIC PANEL WITH GFR
Anion gap: 12 (ref 5–15)
BUN: 18 mg/dL (ref 6–20)
CO2: 25 mmol/L (ref 22–32)
Calcium: 9.9 mg/dL (ref 8.9–10.3)
Chloride: 98 mmol/L (ref 98–111)
Creatinine, Ser: 1.1 mg/dL — ABNORMAL HIGH (ref 0.44–1.00)
GFR, Estimated: 58 mL/min — ABNORMAL LOW (ref >60)
Glucose, Bld: 145 mg/dL — ABNORMAL HIGH (ref 70–99)
Potassium: 4 mmol/L (ref 3.5–5.1)
Sodium: 135 mmol/L (ref 135–145)

## 2024-01-27 LAB — VITAMIN B1: Vitamin B1 (Thiamine): 117.4 nmol/L (ref 66.5–200.0)

## 2024-01-27 LAB — PHOSPHORUS: Phosphorus: 4.1 mg/dL (ref 2.5–4.6)

## 2024-01-27 MED ORDER — PANCREAZE 37000-97300 UNITS PO CPEP: 1.0000 | ORAL_CAPSULE | Freq: Three times a day (TID) | ORAL | 3 refills | Status: AC

## 2024-01-27 MED ORDER — ADULT MULTIVITAMIN W/MINERALS CH: 1.0000 | ORAL_TABLET | Freq: Every day | ORAL | Status: AC

## 2024-01-27 NOTE — Discharge Summary (Signed)
 Physician Discharge Summary   Patient: Melinda [name] MRN: [id number] DOB: 04-29-1961  Admit date:     01/25/2024  Discharge date: 01/27/24  Discharge Physician: MDALA-GAUSI, Jilda Most   PCP: Amadeo June, MD   Recommendations at discharge:    Follow up with PCP  Discharge Diagnoses: Principal Problem:   Weakness Active Problems:   Hypertension   History of chronic pancreatitis   Tobacco dependence   Hyperlipidemia   Asthma, chronic   Vitamin B12 deficiency   Alcohol abuse   Hypoglycemia   Elevated troponin   Macrocytosis  Resolved Problems:   * No resolved hospital problems. *  Hospital Course: 63 year old woman with PMH of T2DM, HTN, HLD, chronic pancreatitis, alcohol use disorder who presented to the ED due to weakness.  Admits to drinking 2-3 drinks nightly.  Complains of balance issues on and off for 3 to 4 months, poor p.o. intake due to abdominal discomfort, diarrhea, low energy on and off for 1 month.  She also complained of hypoglycemic episodes at home, particularly in the morning patient admitted for further evaluation.   The hospital course is in problem-based format below:    Weakness Likely due to deconditioning and possible malnutrition in the setting of chronic pancreatitis, poor p.o. intake, alcohol use disorder. Patient was seen by neurology who noted she had no deficit on neuroexam.  There was a low suspicion for primary neurologic etiology of her generalized weakness and neurology signed off. Physical therapy was ordered and home health PT was recommended.   Chronic pancreatitis Noted on CT. Patient reports bloating with p.o. intake.  She also has symptoms of malabsorption. She has never been on Creon. She was started on Creon and this was also prescribed at discharge.   Alcohol use disorder Patient admits to drinking 2-3 alcoholic drinks every night. States this makes her feel better. Blood alcohol level 66 on presentation. Alcohol cessation  advised. Patient was treated with high-dose thiamine . She was placed on CIWA protocol while admitted but had no withdrawal symptoms.  HTN Patient is on losartan , metoprolol  at home. Home medications were resumed.   T2DM with hypoglycemic episodes Patient is on glipizide  at home. She reports she often has episodes when her blood sugars are in the 40s to 60s. HbA1c: 6.6 Patient has been advised to discontinue glipizide  and focus on dietary control of her diabetes.   Hyperlipidemia Is on Crestor  at home. CK: 139. Home Crestor  was continued.         Consultants: Neurology Procedures performed: n/a  Disposition: Home Diet recommendation:  Discharge Diet Orders (From admission, onward)     Start     Ordered   01/27/24 0000  Diet Carb Modified        01/27/24 1351           Carb modified diet DISCHARGE MEDICATION: Allergies as of 01/27/2024       Reactions   Codeine Itching, Nausea Only        Medication List     STOP taking these medications    glipiZIDE  5 MG tablet Commonly known as: GLUCOTROL        TAKE these medications    albuterol  108 (90 Base) MCG/ACT inhaler Commonly known as: VENTOLIN  HFA Inhale 1 puff into the lungs as needed for wheezing or shortness of breath.   losartan  50 MG tablet Commonly known as: COZAAR  Take 1 tablet (50 mg total) by mouth daily.   meclizine 25 MG tablet Commonly known as: ANTIVERT Take 25 mg  by mouth as needed for dizziness.   metoprolol  tartrate 25 MG tablet Commonly known as: LOPRESSOR  Take 1 tablet (25 mg total) by mouth 2 (two) times daily.   multivitamin with minerals Tabs tablet Take 1 tablet by mouth daily. Start taking on: Jan 28, 2024   NUTRITIONAL SUPPLEMENT PO Take by mouth. Sea moss, "better lungs", and "my biotin" brand biotin   Pancreaze 37000-97300 units Cpep Generic drug: Pancrelipase (Lip-Prot-Amyl) Take 1 capsule (37,000 Units total) by mouth 3 (three) times daily before meals.    rosuvastatin  10 MG tablet Commonly known as: Crestor  Take 1 tablet (10 mg total) by mouth daily.   vitamin B-12 500 MCG tablet Commonly known as: CYANOCOBALAMIN Take 500 mcg by mouth daily.   vitamin D3 25 MCG tablet Commonly known as: CHOLECALCIFEROL Take 1,000 Units by mouth daily.               Durable Medical Equipment  (From admission, onward)           Start     Ordered   01/26/24 1455  For home use only DME Walker rolling  Once       Question Answer Comment  Walker: With 5 Inch Wheels   Patient needs a walker to treat with the following condition Pancreatitis   Patient needs a walker to treat with the following condition Physical deconditioning      01/26/24 1454            Follow-up Information     [facility] Follow up.   Specialty: Rehabilitation Why: Please follow up at the Outpatient Rehabilitation center for Outpatient therapy needs. Contact information: [address] [place]  [phone]        [facility] Follow up.   Why: [employer] will provide a Rolling walker before you are discharged home. Contact information: [address] [place] [address] [phone]                Discharge Exam: Filed Weights   01/25/24 0916 01/26/24 0020  Weight: 63.5 kg 66.9 kg   Physical Exam on Day of Discharge   General: Alert, cheerful, oriented X3  Oral cavity: moist mucous membranes  Neck: supple  Chest: clear to auscultation. No crackles, no wheezes  CVS: S1,S2 RRR. No murmurs  Abd: No distention, soft, non-tender. No masses palpable  Extr: No edema    Condition at discharge: stable  The results of significant diagnostics from this hospitalization (including imaging, microbiology, ancillary and laboratory) are listed below for reference.   Imaging Studies: ECHOCARDIOGRAM COMPLETE Result Date: 01/26/2024    ECHOCARDIOGRAM REPORT    Patient Name:   Melinda [name] Date of Exam: 01/26/2024 Medical Rec #:  [id number]  Height:       62.0 in Accession #:    [id number] Weight:       147.5 lb Date of Birth:  Jun 29, 1961   BSA:          1.680 m Patient Age:    62 years   BP:           148/76 mmHg Patient Gender: F          HR:           91 bpm. Exam Location:  Inpatient Procedure: 2D Echo, Cardiac Doppler and Color Doppler (Both Spectral and Color            Flow Doppler were utilized during procedure). Indications:  Elevated Troponin  History:        Patient has no prior history of Echocardiogram examinations.                 Risk Factors:Hypertension and Diabetes.  Sonographer:    Astrid Blamer Referring Phys: Angelene Kelly IMPRESSIONS  1. Can consider myopericarditis as cause of elevated troponin. Left ventricular ejection fraction, by estimation, is 60 to 65%. The left ventricle has normal function. The left ventricle has no regional wall motion abnormalities. Left ventricular diastolic parameters are consistent with Grade I diastolic dysfunction (impaired relaxation).  2. Right ventricular systolic function is normal. The right ventricular size is normal.  3. A small pericardial effusion is present. The pericardial effusion is anterior to the right ventricle and surrounding the apex.  4. The mitral valve is abnormal. No evidence of mitral valve regurgitation. No evidence of mitral stenosis.  5. The aortic valve is normal in structure. Aortic valve regurgitation is not visualized. No aortic stenosis is present.  6. The inferior vena cava is normal in size with greater than 50% respiratory variability, suggesting right atrial pressure of 3 mmHg. FINDINGS  Left Ventricle: Can consider myopericarditis as cause of elevated troponin. Left ventricular ejection fraction, by estimation, is 60 to 65%. The left ventricle has normal function. The left ventricle has no regional wall motion abnormalities. Strain was  performed and the global longitudinal strain  is indeterminate. The left ventricular internal cavity size was normal in size. There is no left ventricular hypertrophy. Left ventricular diastolic parameters are consistent with Grade I diastolic dysfunction (impaired relaxation). Right Ventricle: The right ventricular size is normal. No increase in right ventricular wall thickness. Right ventricular systolic function is normal. Left Atrium: Left atrial size was normal in size. Right Atrium: Right atrial size was normal in size. Pericardium: A small pericardial effusion is present. The pericardial effusion is anterior to the right ventricle and surrounding the apex. Mitral Valve: The mitral valve is abnormal. There is mild thickening of the mitral valve leaflet(s). There is mild calcification of the mitral valve leaflet(s). Mild mitral annular calcification. No evidence of mitral valve regurgitation. No evidence of mitral valve stenosis. Tricuspid Valve: The tricuspid valve is normal in structure. Tricuspid valve regurgitation is not demonstrated. No evidence of tricuspid stenosis. Aortic Valve: The aortic valve is normal in structure. Aortic valve regurgitation is not visualized. No aortic stenosis is present. Aortic valve mean gradient measures 7.0 mmHg. Aortic valve peak gradient measures 14.1 mmHg. Aortic valve area, by VTI measures 1.80 cm. Pulmonic Valve: The pulmonic valve was normal in structure. Pulmonic valve regurgitation is not visualized. No evidence of pulmonic stenosis. Aorta: The aortic root is normal in size and structure. Venous: The inferior vena cava is normal in size with greater than 50% respiratory variability, suggesting right atrial pressure of 3 mmHg. IAS/Shunts: No atrial level shunt detected by color flow Doppler. Additional Comments: 3D was performed not requiring image post processing on an independent workstation and was indeterminate.  LEFT VENTRICLE PLAX 2D LVIDd:         3.80 cm   Diastology LVIDs:         2.10 cm   LV e' medial:     8.05 cm/s LV PW:         0.90 cm   LV E/e' medial:  11.6 LV IVS:        1.10 cm   LV e' lateral:   5.98 cm/s LVOT diam:  1.90 cm   LV E/e' lateral: 15.6 LV SV:         56 LV SV Index:   34 LVOT Area:     2.84 cm  RIGHT VENTRICLE RV S prime:     15.80 cm/s TAPSE (M-mode): 2.5 cm LEFT ATRIUM             Index        RIGHT ATRIUM          Index LA Vol (A2C):   62.0 ml 36.91 ml/m  RA Area:     8.74 cm LA Vol (A4C):   75.4 ml 44.89 ml/m  RA Volume:   15.30 ml 9.11 ml/m LA Biplane Vol: 67.4 ml 40.13 ml/m  AORTIC VALVE AV Area (Vmax):    1.47 cm AV Area (Vmean):   1.76 cm AV Area (VTI):     1.80 cm AV Vmax:           188.00 cm/s AV Vmean:          124.000 cm/s AV VTI:            0.313 m AV Peak Grad:      14.1 mmHg AV Mean Grad:      7.0 mmHg LVOT Vmax:         97.40 cm/s LVOT Vmean:        76.800 cm/s LVOT VTI:          0.199 m LVOT/AV VTI ratio: 0.64  AORTA Ao Root diam: 2.70 cm MITRAL VALVE MV Area (PHT): 4.17 cm     SHUNTS MV Decel Time: 182 msec     Systemic VTI:  0.20 m MV E velocity: 93.30 cm/s   Systemic Diam: 1.90 cm MV A velocity: 123.00 cm/s MV E/A ratio:  0.76 Janelle Mediate MD Electronically signed by Janelle Mediate MD Signature Date/Time: 01/26/2024/9:27:35 AM    Final    CT ABDOMEN PELVIS WO CONTRAST Result Date: 01/25/2024 CLINICAL DATA:  Acute abdominal pain EXAM: CT ABDOMEN AND PELVIS WITHOUT CONTRAST TECHNIQUE: Multidetector CT imaging of the abdomen and pelvis was performed following the standard protocol without IV contrast. RADIATION DOSE REDUCTION: This exam was performed according to the departmental dose-optimization program which includes automated exposure control, adjustment of the mA and/or kV according to patient size and/or use of iterative reconstruction technique. COMPARISON:  CT abdomen and pelvis 07/14/2014 FINDINGS: Lower chest: No acute abnormality. Hepatobiliary: No focal liver abnormality is seen. Status post cholecystectomy. No biliary dilatation. Pancreas: There are  calcifications and atrophy of the pancreatic tail which has progressed compared to 2015. There is no ductal dilatation or acute inflammation. Spleen: Normal in size without focal abnormality. Adrenals/Urinary Tract: Adrenal glands are unremarkable. Kidneys are normal, without renal calculi, focal lesion, or hydronephrosis. Bladder is unremarkable. Stomach/Bowel: Stomach is within normal limits. Appendix appears normal. No evidence of bowel wall thickening, distention, or inflammatory changes. Vascular/Lymphatic: Aortic atherosclerosis. No enlarged abdominal or pelvic lymph nodes. Reproductive: There is a calcified fibroid in the uterus measuring 8 mm. Adnexa are within normal limits. Other: No abdominal wall hernia or abnormality. No abdominopelvic ascites. Musculoskeletal: No acute or significant osseous findings. IMPRESSION: 1. No acute localizing process in the abdomen or pelvis. 2. Calcifications and atrophy of the pancreatic tail which has progressed compared to 2015. Findings are compatible with chronic pancreatitis. 3. Calcified uterine fibroid. 4. Aortic atherosclerosis. Aortic Atherosclerosis (ICD10-I70.0). Electronically Signed   By: Tyron Gallon M.D.   On: 01/25/2024 23:33   DG  Chest Port 1 View Result Date: 01/25/2024 CLINICAL DATA:  cough EXAM: PORTABLE CHEST - 1 VIEW COMPARISON:  October 28, 2014, December 09, 2010 FINDINGS: No focal airspace consolidation, pleural effusion, or pneumothorax. Calcified granuloma in the left upper lung zone. Mild cardiomegaly. Aortic atherosclerosis. No acute fracture or destructive lesion. Multilevel thoracic osteophytosis. IMPRESSION: Mild cardiomegaly.  Otherwise, no acute cardiopulmonary abnormality. Electronically Signed   By: Rance Burrows M.D.   On: 01/25/2024 17:14   MR BRAIN WO CONTRAST Result Date: 01/25/2024 CLINICAL DATA:  New onset tremor.  Concern for cerebellar pathology. EXAM: MRI HEAD WITHOUT CONTRAST TECHNIQUE: Multiplanar, multiecho pulse sequences  of the brain and surrounding structures were obtained without intravenous contrast. COMPARISON:  Head CT 01/25/2024 FINDINGS: The study is mildly motion degraded. Brain: There is no evidence of an acute infarct, intracranial hemorrhage, mass, midline shift, or extra-axial fluid collection. Small T2 hyperintensities in the cerebral white matter and pons are nonspecific but compatible with mild chronic small vessel ischemic disease. The cerebellum is normal in signal. Mild cerebral atrophy is within normal limits for age. Vascular: Major intracranial vascular flow voids are preserved. Skull and upper cervical spine: Unremarkable bone marrow signal. Sinuses/Orbits: Unremarkable orbits. Minimal mucosal thickening in the paranasal sinuses. Moderate-sized left mastoid effusion. Other: None. IMPRESSION: 1. No acute intracranial abnormality. 2. Mild chronic small vessel ischemic disease. Electronically Signed   By: Aundra Lee M.D.   On: 01/25/2024 14:48   CT Head Wo Contrast Result Date: 01/25/2024 CLINICAL DATA:  New onset tremor. EXAM: CT HEAD WITHOUT CONTRAST TECHNIQUE: Contiguous axial images were obtained from the base of the skull through the vertex without intravenous contrast. RADIATION DOSE REDUCTION: This exam was performed according to the departmental dose-optimization program which includes automated exposure control, adjustment of the mA and/or kV according to patient size and/or use of iterative reconstruction technique. COMPARISON:  Head CT 07/31/2006 FINDINGS: Brain: There is no evidence of an acute infarct, intracranial hemorrhage, mass, midline shift, or extra-axial fluid collection. Cerebral volume is within normal limits for age. The ventricles are normal in size. Vascular: Calcified atherosclerosis at the skull base. No hyperdense vessel. Skull: No fracture or suspicious lesion. Sinuses/Orbits: Mild mucosal thickening in the maxillary sinuses. Moderate-sized left mastoid effusion. Unremarkable  orbits. Other: None. IMPRESSION: 1. Unremarkable CT appearance of the brain. 2. Left mastoid effusion. Electronically Signed   By: Aundra Lee M.D.   On: 01/25/2024 12:08    Microbiology: Results for orders placed or performed during the hospital encounter of 01/25/24  Resp panel by RT-PCR (RSV, Flu A&B, Covid) Anterior Nasal Swab     Status: None   Collection Time: 01/25/24 10:31 AM   Specimen: Anterior Nasal Swab  Result Value Ref Range Status   SARS Coronavirus 2 by RT PCR NEGATIVE NEGATIVE Final   Influenza A by PCR NEGATIVE NEGATIVE Final   Influenza B by PCR NEGATIVE NEGATIVE Final    Comment: (NOTE) The Xpert Xpress SARS-CoV-2/FLU/RSV plus assay is intended as an aid in the diagnosis of influenza from Nasopharyngeal swab specimens and should not be used as a sole basis for treatment. Nasal washings and aspirates are unacceptable for Xpert Xpress SARS-CoV-2/FLU/RSV testing.  Fact Sheet for Patients: BloggerCourse.com  Fact Sheet for Healthcare Providers: SeriousBroker.it  This test is not yet approved or cleared by the [place]  FDA and has been authorized for detection and/or diagnosis of SARS-CoV-2 by FDA under an Emergency Use Authorization (EUA). This EUA will remain in effect (meaning this test can be  used) for the duration of the COVID-19 declaration under Section 564(b)(1) of the Act, 21 U.S.C. section 360bbb-3(b)(1), unless the authorization is terminated or revoked.     Resp Syncytial Virus by PCR NEGATIVE NEGATIVE Final    Comment: (NOTE) Fact Sheet for Patients: BloggerCourse.com  Fact Sheet for Healthcare Providers: SeriousBroker.it  This test is not yet approved or cleared by the [place]  FDA and has been authorized for detection and/or diagnosis of SARS-CoV-2 by FDA under an Emergency Use Authorization (EUA). This EUA will remain in effect  (meaning this test can be used) for the duration of the COVID-19 declaration under Section 564(b)(1) of the Act, 21 U.S.C. section 360bbb-3(b)(1), unless the authorization is terminated or revoked.  Performed at [facility], [address], [place], [place] [address]     Labs: CBC: Recent Labs  Lab 01/25/24 0933 01/25/24 2345 01/26/24 0132  WBC 6.8 7.8 7.9  NEUTROABS 4.0  --   --   HGB 13.4 12.9 12.6  HCT 39.6 37.9 37.0  MCV 103.9* 103.6* 103.1*  PLT 257 245 230   Basic Metabolic Panel: Recent Labs  Lab 01/25/24 0933 01/25/24 2345 01/26/24 0132 01/27/24 0720  NA 132*  --  134* 135  K 5.0  --  4.1 4.0  CL 99  --  98 98  CO2 20*  --  24 25  GLUCOSE 219*  --  119* 145*  BUN 16  --  15 18  CREATININE 1.08* 0.99 0.98 1.10*  CALCIUM  9.1  --  9.2 9.9  MG 2.2  --   --   --   PHOS  --   --   --  4.1   Liver Function Tests: Recent Labs  Lab 01/25/24 0933 01/26/24 0132  AST 46* 38  ALT 37 31  ALKPHOS 80 72  BILITOT 0.7 0.8  PROT 7.5 6.8  ALBUMIN 3.7 3.5   CBG: Recent Labs  Lab 01/26/24 0844 01/26/24 1642 01/26/24 2103 01/27/24 0813 01/27/24 1318  GLUCAP 124* 213* 168* 151* 181*    Discharge time spent: greater than 30 minutes.  Signed: MDALA-GAUSI, Orvetta Danielski AGATHA, MD [facility] 01/27/2024

## 2024-01-27 NOTE — Progress Notes (Signed)
 Mobility Specialist Progress Note:    01/27/24 0810  Therapy Vitals  Temp 98.3 F (36.8 C)  Pulse Rate 75  Resp 18  BP (!) 154/87  Oxygen Therapy  SpO2 96 %  O2 Device Room Air  Mobility  Activity Ambulated with assistance in hallway  Level of Assistance Standby assist, set-up cues, supervision of patient - no hands on  Assistive Device Front wheel walker  Distance Ambulated (ft) 250 ft  Activity Response Tolerated well  Mobility Referral Yes  Mobility visit 1 Mobility  Mobility Specialist Start Time (ACUTE ONLY) [id number]  Mobility Specialist Stop Time (ACUTE ONLY) 0825  Mobility Specialist Time Calculation (min) (ACUTE ONLY) 6 min   Pt received on EOB and agreeable. Required no physical assistance throughout. C/o some lightheadedness that later subsided. Pt left on EOB with call bell and all needs met.  D'Vante Nolon Baxter Mobility Specialist Please contact via Special educational needs teacher or Rehab office at [phone]

## 2024-01-27 NOTE — Plan of Care (Signed)

## 2024-01-27 NOTE — Plan of Care (Signed)
  Problem: Education: Goal: Knowledge of General Education information will improve Description: Including pain rating scale, medication(s)/side effects and non-pharmacologic comfort measures Outcome: Adequate for Discharge   Problem: Health Behavior/Discharge Planning: Goal: Ability to manage health-related needs will improve Outcome: Adequate for Discharge   Problem: Clinical Measurements: Goal: Ability to maintain clinical measurements within normal limits will improve Outcome: Adequate for Discharge Goal: Will remain free from infection Outcome: Adequate for Discharge Goal: Diagnostic test results will improve Outcome: Adequate for Discharge Goal: Respiratory complications will improve Outcome: Adequate for Discharge Goal: Cardiovascular complication will be avoided Outcome: Adequate for Discharge   Problem: Activity: Goal: Risk for activity intolerance will decrease Outcome: Adequate for Discharge   Problem: Nutrition: Goal: Adequate nutrition will be maintained Outcome: Adequate for Discharge   Problem: Coping: Goal: Level of anxiety will decrease Outcome: Adequate for Discharge   Problem: Elimination: Goal: Will not experience complications related to bowel motility Outcome: Adequate for Discharge Goal: Will not experience complications related to urinary retention Outcome: Adequate for Discharge   Problem: Pain Managment: Goal: General experience of comfort will improve and/or be controlled Outcome: Adequate for Discharge   Problem: Safety: Goal: Ability to remain free from injury will improve Outcome: Adequate for Discharge   Problem: Skin Integrity: Goal: Risk for impaired skin integrity will decrease Outcome: Adequate for Discharge   Problem: Acute Rehab PT Goals(only PT should resolve) Goal: Pt Will Go Supine/Side To Sit Outcome: Adequate for Discharge Goal: Patient Will Transfer Sit To/From Stand Outcome: Adequate for Discharge Goal: Pt Will  Ambulate Outcome: Adequate for Discharge Goal: Pt Will Go Up/Down Stairs Outcome: Adequate for Discharge   Problem: Education: Goal: Ability to describe self-care measures that may prevent or decrease complications (Diabetes Survival Skills Education) will improve Outcome: Adequate for Discharge Goal: Individualized Educational Video(s) Outcome: Adequate for Discharge   Problem: Coping: Goal: Ability to adjust to condition or change in health will improve Outcome: Adequate for Discharge   Problem: Fluid Volume: Goal: Ability to maintain a balanced intake and output will improve Outcome: Adequate for Discharge   Problem: Health Behavior/Discharge Planning: Goal: Ability to identify and utilize available resources and services will improve Outcome: Adequate for Discharge Goal: Ability to manage health-related needs will improve Outcome: Adequate for Discharge   Problem: Metabolic: Goal: Ability to maintain appropriate glucose levels will improve Outcome: Adequate for Discharge   Problem: Nutritional: Goal: Maintenance of adequate nutrition will improve Outcome: Adequate for Discharge Goal: Progress toward achieving an optimal weight will improve Outcome: Adequate for Discharge   Problem: Skin Integrity: Goal: Risk for impaired skin integrity will decrease Outcome: Adequate for Discharge   Problem: Tissue Perfusion: Goal: Adequacy of tissue perfusion will improve Outcome: Adequate for Discharge

## 2024-01-27 NOTE — Progress Notes (Signed)
 Discharge instructions (including medications) discussed with and copy provided to patient/caregiver

## 2024-01-27 NOTE — Progress Notes (Signed)
 Hi, Pt is reading trigeminy PVC's on tele started around 1005. Pt also c/o feeling weak, a little bit dizzy. BP read 141/54, MAP-70, P-80, O2-96 on RA   Paged Mdala-Guasi, Masiku Agartha.

## 2024-02-01 ENCOUNTER — Telehealth: Payer: Self-pay | Admitting: Family Medicine

## 2024-02-01 DIAGNOSIS — I10 Essential (primary) hypertension: Secondary | ICD-10-CM

## 2024-02-01 NOTE — Telephone Encounter (Signed)
 Prescription Request  02/01/2024  LOV: 12/13/2023  What is the name of the medication or equipment? rosuvastatin  (CRESTOR ) 10 MG tablet   Have you contacted your pharmacy to request a refill? Yes   Which pharmacy would you like this sent to?  [facility] - [place], [place] - [address] [place] [address] Phone: [phone] Fax: [phone]    Patient notified that their request is being sent to the clinical staff for review and that they should receive a response within 2 business days.   Please advise at [facility] [phone]

## 2024-02-01 NOTE — Telephone Encounter (Signed)
 Prescription Request  02/01/2024  LOV: 12/13/2023  What is the name of the medication or equipment? metoprolol  tartrate (LOPRESSOR ) 25 MG tablet   Have you contacted your pharmacy to request a refill? Yes   Which pharmacy would you like this sent to?  [facility] - [place], [place] - [address] [place] [address] Phone: [phone] Fax: [phone]    Patient notified that their request is being sent to the clinical staff for review and that they should receive a response within 2 business days.   Please advise at [facility] [phone]

## 2024-02-01 NOTE — Telephone Encounter (Signed)
 Prescription Request  02/01/2024  LOV: 12/13/2023  What is the name of the medication or equipment? losartan  (COZAAR ) 50 MG tablet [   Have you contacted your pharmacy to request a refill? Yes   Which pharmacy would you like this sent to?  [facility] - [place], [place] - [address] [place] [address] Phone: [phone] Fax: [phone]    Patient notified that their request is being sent to the clinical staff for review and that they should receive a response within 2 business days.   Please advise at [facility] [phone]

## 2024-02-01 NOTE — Telephone Encounter (Signed)
 Prescription Request  02/01/2024  LOV: 12/13/2023  What is the name of the medication or equipment? Glipizide  tabs 5 mg 90 day supply [facility] requesting directions  Have you contacted your pharmacy to request a refill? Yes   Which pharmacy would you like this sent to?  [facility] - [place], [place] - [address] [place] [address] Phone: [phone] Fax: [phone]    Patient notified that their request is being sent to the clinical staff for review and that they should receive a response within 2 business days.   Please advise at [facility] [phone]

## 2024-02-05 NOTE — Telephone Encounter (Signed)
 Requested medication (s) are due for refill today: routing for review  Requested medication (s) are on the active medication list: yes  Last refill:  12/13/23  Future visit scheduled: no  Notes to clinic:  called patient to verify [facility]. No answer, left message to call back. [facility], but [facility] was listed on the medication request. Routing for review .     Requested Prescriptions  Pending Prescriptions Disp Refills   losartan  (COZAAR ) 50 MG tablet 90 tablet 1    Sig: Take 1 tablet (50 mg total) by mouth daily.     Cardiovascular:  Angiotensin Receptor Blockers Failed - 02/05/2024  9:54 AM      Failed - Cr in normal range and within 180 days    Creat  Date Value Ref Range Status  12/13/2023 0.92 0.50 - 1.05 mg/dL Final   Creatinine, Ser  Date Value Ref Range Status  01/27/2024 1.10 (H) 0.44 - 1.00 mg/dL Final         Failed - Last BP in normal range    BP Readings from Last 1 Encounters:  01/27/24 (!) 141/54         Failed - Valid encounter within last 6 months    Recent Outpatient Visits           1 month ago Hypertension, unspecified type   [place] Medicine Amadeo June, MD   10 years ago Uncontrolled hypertension   [place] [facility] [place] - A Dept Of [place]. [facility] Jegede, Olugbemiga E, MD   10 years ago Lipoma of back   [facility] Health Comm Health [place] - A Dept Of [place]. [facility] Dhungel, [place], MD              Passed - K in normal range and within 180 days    Potassium  Date Value Ref Range Status  01/27/2024 4.0 3.5 - 5.1 mmol/L Final         Passed - Patient is not pregnant       metoprolol  tartrate (LOPRESSOR ) 25 MG tablet 180 tablet 1    Sig: Take 1 tablet (25 mg total) by mouth 2 (two) times daily.     Cardiovascular:  Beta Blockers Failed - 02/05/2024  9:54 AM      Failed - Last BP in normal range     BP Readings from Last 1 Encounters:  01/27/24 (!) 141/54         Failed - Valid encounter within last 6 months    Recent Outpatient Visits           1 month ago Hypertension, unspecified type   [facility] Medicine Amadeo June, MD   10 years ago Uncontrolled hypertension   [place] Comm Health [place] - A Dept Of [place]. [facility] Jegede, Olugbemiga E, MD   10 years ago Lipoma of back   [facility] Health Comm Health [place] - A Dept Of [place]. [facility] Dhungel, [place], MD              Passed - Last Heart Rate in normal range    Pulse Readings from Last 1 Encounters:  01/27/24 75          rosuvastatin  (CRESTOR ) 10 MG tablet 90 tablet 0    Sig: Take 1 tablet (10 mg total) by mouth daily.  Cardiovascular:  Antilipid - Statins 2 Failed - 02/05/2024  9:54 AM      Failed - Cr in normal range and within 360 days    Creat  Date Value Ref Range Status  12/13/2023 0.92 0.50 - 1.05 mg/dL Final   Creatinine, Ser  Date Value Ref Range Status  01/27/2024 1.10 (H) 0.44 - 1.00 mg/dL Final         Failed - Valid encounter within last 12 months    Recent Outpatient Visits           1 month ago Hypertension, unspecified type   [place] [facility] Medicine Amadeo June, MD   10 years ago Uncontrolled hypertension   [place] Comm Health [place] - A Dept Of [place]. [facility] Jegede, Olugbemiga E, MD   10 years ago Lipoma of back   [facility] Health Comm Health [place] - A Dept Of [place]. [facility] Dhungel, [place], MD              Failed - Lipid Panel in normal range within the last 12 months    Cholesterol  Date Value Ref Range Status  12/13/2023 243 (H) <200 mg/dL Final   LDL Cholesterol (Calc)  Date Value Ref Range Status  12/13/2023 163 (H) mg/dL (calc) Final    Comment:    Reference range: <100 . Desirable range <100 mg/dL for primary prevention;   <70 mg/dL for patients  with CHD or diabetic patients  with > or = 2 CHD risk factors. Aaron Aas LDL-C is now calculated using the Martin-Hopkins  calculation, which is a validated novel method providing  better accuracy than the Friedewald equation in the  estimation of LDL-C.  Melinda Sprawls et al. Erroll Heard. 8119;147(82): 2061-2068  (http://education.Quest[facility]ostics.com/faq/FAQ164)    HDL  Date Value Ref Range Status  12/13/2023 50 > OR = 50 mg/dL Final   Triglycerides  Date Value Ref Range Status  12/13/2023 153 (H) <150 mg/dL Final         Passed - Patient is not pregnant       glipiZIDE  (GLUCOTROL ) 5 MG tablet 30 tablet 3    Sig: Take 1 tablet (5 mg total) by mouth daily before breakfast.     Endocrinology:  Diabetes - Sulfonylureas Failed - 02/05/2024  9:54 AM      Failed - Cr in normal range and within 360 days    Creat  Date Value Ref Range Status  12/13/2023 0.92 0.50 - 1.05 mg/dL Final   Creatinine, Ser  Date Value Ref Range Status  01/27/2024 1.10 (H) 0.44 - 1.00 mg/dL Final         Failed - Valid encounter within last 6 months    Recent Outpatient Visits           1 month ago Hypertension, unspecified type   [place] [facility] Medicine Amadeo June, MD   10 years ago Uncontrolled hypertension   [place] Comm Health [place] - A Dept Of [place]. [facility] Jegede, Olugbemiga E, MD   10 years ago Lipoma of back   [facility] Health Comm Health [place] - A Dept Of [place]. [facility] Dhungel, [place], MD              Passed - HBA1C is between 0 and 7.9 and within 180 days    Hgb A1c MFr Bld  Date Value Ref Range Status  01/25/2024 6.6 (H) 4.8 - 5.6 %  Final    Comment:    (NOTE) Pre diabetes:          5.7%-6.4%  Diabetes:              >6.4%  Glycemic control for   <7.0% adults with diabetes

## 2024-02-06 ENCOUNTER — Other Ambulatory Visit: Payer: Self-pay | Admitting: Family Medicine

## 2024-02-06 DIAGNOSIS — I10 Essential (primary) hypertension: Secondary | ICD-10-CM

## 2024-02-06 NOTE — Telephone Encounter (Signed)
 Prescription Request  02/06/2024  LOV: 12/13/2023  What is the name of the medication or equipment? Losartan  tabs   Have you contacted your pharmacy to request a refill? Yes   Which pharmacy would you like this sent to?  [facility] - [place], [place] - [address] [place] [address] Phone: [phone] Fax: [phone]    Patient notified that their request is being sent to the clinical staff for review and that they should receive a response within 2 business days.   Please advise at [facility] [phone]

## 2024-02-06 NOTE — Telephone Encounter (Signed)
 Prescription Request  02/06/2024  LOV: 12/13/2023  What is the name of the medication or equipment? metoprolol  tartrate (LOPRESSOR ) 25 MG tablet   Have you contacted your pharmacy to request a refill? Yes   Which pharmacy would you like this sent to?  [facility] - [place], [place] - [address] [place] [address] Phone: [phone] Fax: [phone]    Patient notified that their request is being sent to the clinical staff for review and that they should receive a response within 2 business days.   Please advise at [facility] [phone]

## 2024-02-06 NOTE — Telephone Encounter (Signed)
 Prescription Request  02/06/2024  LOV: 12/13/2023  What is the name of the medication or equipment? rosuvastatin  (CRESTOR ) 10 MG tablet   Have you contacted your pharmacy to request a refill? Yes   Which pharmacy would you like this sent to?  [facility] - [place], [place] - [address] [place] [address] Phone: [phone] Fax: [phone]    Patient notified that their request is being sent to the clinical staff for review and that they should receive a response within 2 business days.   Please advise at [facility] [phone] (mobile)

## 2024-02-06 NOTE — Telephone Encounter (Signed)
 Prescription Request  02/06/2024  LOV: 12/13/2023  What is the name of the medication or equipment? Glipizide  tabs 5 mg  Have you contacted your pharmacy to request a refill? Yes   Which pharmacy would you like this sent to?  [facility] - [place], [place] - [address] [place] [address] Phone: [phone] Fax: [phone]    Patient notified that their request is being sent to the clinical staff for review and that they should receive a response within 2 business days.   Please advise at [facility] [phone]

## 2024-02-07 ENCOUNTER — Telehealth: Payer: Self-pay | Admitting: Family Medicine

## 2024-02-07 NOTE — Telephone Encounter (Signed)
 Pt is not currently prescribed medication, there is no note in the chart that suggest the provider would like to start on this medication. Escribe denied.

## 2024-02-07 NOTE — Telephone Encounter (Signed)
 Not on current med list   Copied from CRM [id number]. Topic: Clinical - Prescription Issue >> Feb 07, 2024 11:38 AM [employer] wrote: Reason for CRM: Pharmacy called to have Glipizide  5 MG escribed to them. They stated they have not received it yet.  Preferred Pharmacy: [facility] - [place], [place] - [address] [place] [address]JWJXB: [phone] Fax: [phone]: Not open 24 hours

## 2024-02-08 MED ORDER — ROSUVASTATIN CALCIUM 10 MG PO TABS: 10.0000 mg | ORAL_TABLET | Freq: Every day | ORAL | 1 refills | Status: DC

## 2024-02-08 MED ORDER — LOSARTAN POTASSIUM 50 MG PO TABS: 50.0000 mg | ORAL_TABLET | Freq: Every day | ORAL | 1 refills | Status: AC

## 2024-02-08 MED ORDER — METOPROLOL TARTRATE 25 MG PO TABS
25.0000 mg | ORAL_TABLET | Freq: Two times a day (BID) | ORAL | 1 refills | Status: AC
Start: 2024-02-08 — End: ?

## 2024-02-08 NOTE — Telephone Encounter (Signed)
 [facility]. Last refilled 12/13/23 for [facility]  Requested Prescriptions  Pending Prescriptions Disp Refills   losartan  (COZAAR ) 50 MG tablet 90 tablet 1    Sig: Take 1 tablet (50 mg total) by mouth daily.     Cardiovascular:  Angiotensin Receptor Blockers Failed - 02/08/2024  8:18 AM      Failed - Cr in normal range and within 180 days    Creat  Date Value Ref Range Status  12/13/2023 0.92 0.50 - 1.05 mg/dL Final   Creatinine, Ser  Date Value Ref Range Status  01/27/2024 1.10 (H) 0.44 - 1.00 mg/dL Final         Failed - Last BP in normal range    BP Readings from Last 1 Encounters:  01/27/24 (!) 141/54         Failed - Valid encounter within last 6 months    Recent Outpatient Visits           1 month ago Hypertension, unspecified type   [facility] Medicine Amadeo June, MD   10 years ago Uncontrolled hypertension   [place] Comm Health [place] - A Dept Of [place]. [facility] Jegede, Olugbemiga E, MD   10 years ago Lipoma of back   [facility] Health Comm Health [place] - A Dept Of [place]. [facility] Dhungel, [place], MD              Passed - K in normal range and within 180 days    Potassium  Date Value Ref Range Status  01/27/2024 4.0 3.5 - 5.1 mmol/L Final         Passed - Patient is not pregnant       metoprolol  tartrate (LOPRESSOR ) 25 MG tablet 180 tablet 1    Sig: Take 1 tablet (25 mg total) by mouth 2 (two) times daily.     Cardiovascular:  Beta Blockers Failed - 02/08/2024  8:18 AM      Failed - Last BP in normal range    BP Readings from Last 1 Encounters:  01/27/24 (!) 141/54         Failed - Valid encounter within last 6 months    Recent Outpatient Visits           1 month ago Hypertension, unspecified type   [facility] Medicine Amadeo June, MD   10 years ago Uncontrolled hypertension     Comm Health [place] - A Dept Of [place]. [facility] Jegede, Olugbemiga E, MD   10 years ago Lipoma of back   [facility] Health Comm Health [place] - A Dept Of [place]. [facility] Dhungel, [place], MD              Passed - Last Heart Rate in normal range    Pulse Readings from Last 1 Encounters:  01/27/24 75          rosuvastatin  (CRESTOR ) 10 MG tablet 90 tablet 1    Sig: Take 1 tablet (10 mg total) by mouth daily.     Cardiovascular:  Antilipid - Statins 2 Failed - 02/08/2024  8:18 AM      Failed - Cr in normal range and within 360 days    Creat  Date Value Ref Range Status  12/13/2023 0.92 0.50 - 1.05 mg/dL Final   Creatinine, Ser  Date Value Ref Range Status  01/27/2024 1.10 (H) 0.44 - 1.00 mg/dL Final         Failed - Valid encounter within last 12 months    Recent Outpatient Visits           1 month ago Hypertension, unspecified type   [facility] Medicine Amadeo June, MD   10 years ago Uncontrolled hypertension   [place] Comm Health [place] - A Dept Of [place]. [facility] Jegede, Olugbemiga E, MD   10 years ago Lipoma of back   [facility] Health Comm Health [place] - A Dept Of [place]. [facility] Dhungel, [place], MD              Failed - Lipid Panel in normal range within the last 12 months    Cholesterol  Date Value Ref Range Status  12/13/2023 243 (H) <200 mg/dL Final   LDL Cholesterol (Calc)  Date Value Ref Range Status  12/13/2023 163 (H) mg/dL (calc) Final    Comment:    Reference range: <100 . Desirable range <100 mg/dL for primary prevention;   <70 mg/dL for patients with CHD or diabetic patients  with > or = 2 CHD risk factors. Aaron Aas LDL-C is now calculated using the Martin-Hopkins  calculation, which is a validated novel method providing  better accuracy than the Friedewald equation in the  estimation of LDL-C.  Melinda Sprawls et al. Erroll Heard. 4098;119(14): 2061-2068   (http://education.Quest[facility].com/faq/FAQ164)    HDL  Date Value Ref Range Status  12/13/2023 50 > OR = 50 mg/dL Final   Triglycerides  Date Value Ref Range Status  12/13/2023 153 (H) <150 mg/dL Final         Passed - Patient is not pregnant       glipiZIDE  (GLUCOTROL ) 5 MG tablet 30 tablet 3    Sig: Take 1 tablet (5 mg total) by mouth daily before breakfast.     Endocrinology:  Diabetes - Sulfonylureas Failed - 02/08/2024  8:18 AM      Failed - Cr in normal range and within 360 days    Creat  Date Value Ref Range Status  12/13/2023 0.92 0.50 - 1.05 mg/dL Final   Creatinine, Ser  Date Value Ref Range Status  01/27/2024 1.10 (H) 0.44 - 1.00 mg/dL Final         Failed - Valid encounter within last 6 months    Recent Outpatient Visits           1 month ago Hypertension, unspecified type   [place] [facility] Medicine Amadeo June, MD   10 years ago Uncontrolled hypertension   [place] Comm Health [place] - A Dept Of [place]. [facility] Jegede, Olugbemiga E, MD   10 years ago Lipoma of back   [facility] Comm Health [place] - A Dept Of [place]. [facility] Dhungel, [place], MD              Passed - HBA1C is between 0 and 7.9 and within 180 days    Hgb A1c MFr Bld  Date Value Ref Range Status  01/25/2024 6.6 (H) 4.8 - 5.6 % Final    Comment:    (NOTE) Pre diabetes:          5.7%-6.4%  Diabetes:              >6.4%  Glycemic control for   <7.0% adults with diabetes

## 2024-02-08 NOTE — Telephone Encounter (Signed)
 Requested medication (s) are due for refill today: yes, new Rx  Requested medication (s) are on the active medication list: no, expired  Last refill:  12/18/23  Future visit scheduled: no  Notes to clinic:  Medication needs new Rx for refill. Patient messages 12/18/23 indicates that patient did not want Metformin  and the provider suggested Glipizide  5 mg, because it was generic brand and would be covered by insurance.  Please advise provider that a new prescription for Glipizide  5 mg  is needed and [facility].     Requested Prescriptions  Pending Prescriptions Disp Refills   glipiZIDE  (GLUCOTROL ) 5 MG tablet 30 tablet 3    Sig: Take 1 tablet (5 mg total) by mouth daily before breakfast.     Endocrinology:  Diabetes - Sulfonylureas Failed - 02/08/2024  8:22 AM      Failed - Cr in normal range and within 360 days    Creat  Date Value Ref Range Status  12/13/2023 0.92 0.50 - 1.05 mg/dL Final   Creatinine, Ser  Date Value Ref Range Status  01/27/2024 1.10 (H) 0.44 - 1.00 mg/dL Final         Failed - Valid encounter within last 6 months    Recent Outpatient Visits           1 month ago Hypertension, unspecified type   [facility] Medicine Amadeo June, MD   10 years ago Uncontrolled hypertension   [place] Comm Health [place] - A Dept Of [place]. [facility] Jegede, Olugbemiga E, MD   10 years ago Lipoma of back   [facility] Health Comm Health [place] - A Dept Of [place]. [facility] Dhungel, [place], MD              Passed - HBA1C is between 0 and 7.9 and within 180 days    Hgb A1c MFr Bld  Date Value Ref Range Status  01/25/2024 6.6 (H) 4.8 - 5.6 % Final    Comment:    (NOTE) Pre diabetes:          5.7%-6.4%  Diabetes:              >6.4%  Glycemic control for   <7.0% adults with diabetes          Signed Prescriptions Disp Refills   losartan  (COZAAR ) 50 MG tablet 90 tablet 1     Sig: Take 1 tablet (50 mg total) by mouth daily.     Cardiovascular:  Angiotensin Receptor Blockers Failed - 02/08/2024  8:22 AM      Failed - Cr in normal range and within 180 days    Creat  Date Value Ref Range Status  12/13/2023 0.92 0.50 - 1.05 mg/dL Final   Creatinine, Ser  Date Value Ref Range Status  01/27/2024 1.10 (H) 0.44 - 1.00 mg/dL Final         Failed - Last BP in normal range    BP Readings from Last 1 Encounters:  01/27/24 (!) 141/54         Failed - Valid encounter within last 6 months    Recent Outpatient Visits           1 month ago Hypertension, unspecified type   [facility] Medicine Amadeo June, MD   10 years ago Uncontrolled hypertension   [place] Comm Health [place] - A Dept Of . [facility]  Jegede, Olugbemiga E, MD   10 years ago Lipoma of back   [place] Comm [facility] - A Dept Of [place]. [facility] Dhungel, [place], MD              Passed - K in normal range and within 180 days    Potassium  Date Value Ref Range Status  01/27/2024 4.0 3.5 - 5.1 mmol/L Final         Passed - Patient is not pregnant       metoprolol  tartrate (LOPRESSOR ) 25 MG tablet 180 tablet 1    Sig: Take 1 tablet (25 mg total) by mouth 2 (two) times daily.     Cardiovascular:  Beta Blockers Failed - 02/08/2024  8:22 AM      Failed - Last BP in normal range    BP Readings from Last 1 Encounters:  01/27/24 (!) 141/54         Failed - Valid encounter within last 6 months    Recent Outpatient Visits           1 month ago Hypertension, unspecified type   [facility] Medicine Amadeo June, MD   10 years ago Uncontrolled hypertension   [place] Comm Health [place] - A Dept Of [place]. [facility] Jegede, Olugbemiga E, MD   10 years ago Lipoma of back   [facility] Health Comm Health [place] - A Dept Of [place]. [facility] Dhungel, [place], MD               Passed - Last Heart Rate in normal range    Pulse Readings from Last 1 Encounters:  01/27/24 75          rosuvastatin  (CRESTOR ) 10 MG tablet 90 tablet 1    Sig: Take 1 tablet (10 mg total) by mouth daily.     Cardiovascular:  Antilipid - Statins 2 Failed - 02/08/2024  8:22 AM      Failed - Cr in normal range and within 360 days    Creat  Date Value Ref Range Status  12/13/2023 0.92 0.50 - 1.05 mg/dL Final   Creatinine, Ser  Date Value Ref Range Status  01/27/2024 1.10 (H) 0.44 - 1.00 mg/dL Final         Failed - Valid encounter within last 12 months    Recent Outpatient Visits           1 month ago Hypertension, unspecified type   [place] [facility] Medicine Amadeo June, MD   10 years ago Uncontrolled hypertension    Comm Health [place] - A Dept Of [place]. [facility] Jegede, Olugbemiga E, MD   10 years ago Lipoma of back   [facility] Health Comm Health [place] - A Dept Of [place]. [facility] Dhungel, [place], MD              Failed - Lipid Panel in normal range within the last 12 months    Cholesterol  Date Value Ref Range Status  12/13/2023 243 (H) <200 mg/dL Final   LDL Cholesterol (Calc)  Date Value Ref Range Status  12/13/2023 163 (H) mg/dL (calc) Final    Comment:    Reference range: <100 . Desirable range <100 mg/dL for primary prevention;   <70 mg/dL for patients with CHD or diabetic patients  with > or = 2 CHD risk factors. Aaron Aas LDL-C is now calculated using the Martin-Hopkins  calculation, which is a validated novel method providing  better accuracy than the Friedewald equation in the  estimation of LDL-C.  Melinda Sprawls et al. Erroll Heard. 1610;960(45): 2061-2068  (http://education.[facility].com/faq/FAQ164)    HDL  Date Value Ref Range Status  12/13/2023 50 > OR = 50 mg/dL Final   Triglycerides  Date Value Ref Range Status  12/13/2023 153 (H) <150 mg/dL Final         Passed - Patient is not  pregnant

## 2024-05-09 ENCOUNTER — Telehealth: Payer: Self-pay | Admitting: Family Medicine

## 2024-05-09 ENCOUNTER — Other Ambulatory Visit: Payer: Self-pay

## 2024-05-09 DIAGNOSIS — R7303 Prediabetes: Secondary | ICD-10-CM

## 2024-05-09 DIAGNOSIS — E7849 Other hyperlipidemia: Secondary | ICD-10-CM

## 2024-05-09 MED ORDER — GLIPIZIDE 5 MG PO TABS: 5.0000 mg | ORAL_TABLET | Freq: Every morning | ORAL | 1 refills | Status: DC

## 2024-05-09 NOTE — Telephone Encounter (Signed)
 Prescription Request  05/09/2024  LOV: 12/13/2023  What is the name of the medication or equipment?   glipiZIDE  (GLUCOTROL ) 5 MG tablet [[id number]]  DISCONTINUED  **90 day script requested**  Have you contacted your pharmacy to request a refill? Yes   Which pharmacy would you like this sent to?  [facility] - [place], [place] - [address] [place] [address] Phone: [phone] Fax: [phone]    Patient notified that their request is being sent to the clinical staff for review and that they should receive a response within 2 business days.   Please advise pharmacist.

## 2024-05-14 ENCOUNTER — Other Ambulatory Visit: Payer: Self-pay | Admitting: Family Medicine

## 2024-05-14 DIAGNOSIS — E7849 Other hyperlipidemia: Secondary | ICD-10-CM

## 2024-05-14 DIAGNOSIS — R7303 Prediabetes: Secondary | ICD-10-CM

## 2024-06-02 ENCOUNTER — Other Ambulatory Visit: Payer: Self-pay | Admitting: Family Medicine

## 2024-06-04 NOTE — Telephone Encounter (Signed)
 Change of pharmacy  Requested Prescriptions  Pending Prescriptions Disp Refills   rosuvastatin  (CRESTOR ) 10 MG tablet [Pharmacy Med Name: ROSUVASTATIN  CALCIUM  10 MG TAB] 90 tablet 0    Sig: TAKE 1 TABLET BY MOUTH EVERY DAY     Cardiovascular:  Antilipid - Statins 2 Failed - 06/04/2024 10:42 AM      Failed - Cr in normal range and within 360 days    Creat  Date Value Ref Range Status  12/13/2023 0.92 0.50 - 1.05 mg/dL Final   Creatinine, Ser  Date Value Ref Range Status  01/27/2024 1.10 (H) 0.44 - 1.00 mg/dL Final         Failed - Lipid Panel in normal range within the last 12 months    Cholesterol  Date Value Ref Range Status  12/13/2023 243 (H) <200 mg/dL Final   LDL Cholesterol (Calc)  Date Value Ref Range Status  12/13/2023 163 (H) mg/dL (calc) Final    Comment:    Reference range: <100 . Desirable range <100 mg/dL for primary prevention;   <70 mg/dL for patients with CHD or diabetic patients  with > or = 2 CHD risk factors. SABRA LDL-C is now calculated using the Martin-Hopkins  calculation, which is a validated novel method providing  better accuracy than the Friedewald equation in the  estimation of LDL-C.  Gladis APPLETHWAITE et al. SANDREA. 7986;689(80): 2061-2068  (http://education.[facility].com/faq/FAQ164)    HDL  Date Value Ref Range Status  12/13/2023 50 > OR = 50 mg/dL Final   Triglycerides  Date Value Ref Range Status  12/13/2023 153 (H) <150 mg/dL Final         Passed - Patient is not pregnant      Passed - Valid encounter within last 12 months    Recent Outpatient Visits           5 months ago Hypertension, unspecified type   [facility] Aletha Bene, MD   10 years ago Uncontrolled hypertension   [facility] [place] Jegede, Olugbemiga E, MD   11 years ago Lipoma of back   [facility] [place] [facility] [place]. [facility] Dhungel,  [place], MD

## 2024-06-17 ENCOUNTER — Other Ambulatory Visit: Payer: Self-pay | Admitting: Family Medicine

## 2024-06-17 DIAGNOSIS — E7849 Other hyperlipidemia: Secondary | ICD-10-CM

## 2024-06-17 DIAGNOSIS — R7303 Prediabetes: Secondary | ICD-10-CM

## 2024-08-28 ENCOUNTER — Other Ambulatory Visit: Payer: Self-pay | Admitting: Family Medicine

## 2024-08-28 DIAGNOSIS — J4520 Mild intermittent asthma, uncomplicated: Secondary | ICD-10-CM

## 2024-09-01 ENCOUNTER — Other Ambulatory Visit: Payer: Self-pay | Admitting: Family Medicine

## 2024-09-01 DIAGNOSIS — I10 Essential (primary) hypertension: Secondary | ICD-10-CM
